# Patient Record
Sex: Male | Born: 1970
Health system: Southern US, Community
[De-identification: ages and names within clinical notes are randomized; demographics above are authoritative.]

## PROBLEM LIST (undated history)

## (undated) DIAGNOSIS — K2270 Barrett's esophagus without dysplasia: Secondary | ICD-10-CM

## (undated) DIAGNOSIS — F32A Depression, unspecified: Secondary | ICD-10-CM

## (undated) DIAGNOSIS — K56609 Unspecified intestinal obstruction, unspecified as to partial versus complete obstruction: Secondary | ICD-10-CM

## (undated) DIAGNOSIS — F329 Major depressive disorder, single episode, unspecified: Secondary | ICD-10-CM

## (undated) DIAGNOSIS — F419 Anxiety disorder, unspecified: Secondary | ICD-10-CM

## (undated) DIAGNOSIS — K219 Gastro-esophageal reflux disease without esophagitis: Secondary | ICD-10-CM

## (undated) HISTORY — PX: NISSEN FUNDOPLICATION: SHX2091

## (undated) HISTORY — DX: Major depressive disorder, single episode, unspecified: F32.9

## (undated) HISTORY — DX: Anxiety disorder, unspecified: F41.9

## (undated) HISTORY — PX: ESOPHAGUS SURGERY: SHX626

## (undated) HISTORY — DX: Barrett's esophagus without dysplasia: K22.70

## (undated) HISTORY — DX: Depression, unspecified: F32.A

## (undated) HISTORY — DX: Gastro-esophageal reflux disease without esophagitis: K21.9

---

## 2004-11-02 ENCOUNTER — Ambulatory Visit: Payer: Self-pay | Admitting: Gastroenterology

## 2005-07-08 ENCOUNTER — Ambulatory Visit: Payer: Self-pay | Admitting: Gastroenterology

## 2005-07-17 ENCOUNTER — Ambulatory Visit: Payer: Self-pay | Admitting: Gastroenterology

## 2005-08-12 ENCOUNTER — Inpatient Hospital Stay: Payer: Self-pay | Admitting: Surgery

## 2006-11-26 ENCOUNTER — Ambulatory Visit: Payer: Self-pay | Admitting: Gastroenterology

## 2009-12-20 ENCOUNTER — Ambulatory Visit: Payer: Self-pay | Admitting: Gastroenterology

## 2009-12-22 LAB — PATHOLOGY REPORT

## 2013-02-08 ENCOUNTER — Ambulatory Visit: Payer: Self-pay | Admitting: Gastroenterology

## 2013-02-09 LAB — PATHOLOGY REPORT

## 2014-01-13 ENCOUNTER — Emergency Department: Payer: Self-pay | Admitting: Emergency Medicine

## 2014-01-13 LAB — CBC WITH DIFFERENTIAL/PLATELET
Basophil #: 0.1 10*3/uL (ref 0.0–0.1)
Basophil %: 1.1 %
Eosinophil #: 0.4 10*3/uL (ref 0.0–0.7)
Eosinophil %: 5.4 %
HCT: 43.4 % (ref 40.0–52.0)
HGB: 14.9 g/dL (ref 13.0–18.0)
Lymphocyte #: 3.3 10*3/uL (ref 1.0–3.6)
Lymphocyte %: 40.9 %
MCH: 30.1 pg (ref 26.0–34.0)
MCHC: 34.3 g/dL (ref 32.0–36.0)
MCV: 88 fL (ref 80–100)
Monocyte #: 0.9 x10 3/mm (ref 0.2–1.0)
Monocyte %: 11.2 %
Neutrophil #: 3.4 10*3/uL (ref 1.4–6.5)
Neutrophil %: 41.4 %
Platelet: 251 10*3/uL (ref 150–440)
RBC: 4.95 10*6/uL (ref 4.40–5.90)
RDW: 14.2 % (ref 11.5–14.5)
WBC: 8.1 10*3/uL (ref 3.8–10.6)

## 2014-01-13 LAB — URINALYSIS, COMPLETE
BACTERIA: NONE SEEN
Bilirubin,UR: NEGATIVE
Glucose,UR: NEGATIVE mg/dL (ref 0–75)
Ketone: NEGATIVE
Leukocyte Esterase: NEGATIVE
Nitrite: NEGATIVE
Ph: 7 (ref 4.5–8.0)
Protein: NEGATIVE
SPECIFIC GRAVITY: 1.006 (ref 1.003–1.030)
Squamous Epithelial: <1
WBC UR: NONE SEEN /HPF (ref 0–5)

## 2014-01-13 LAB — COMPREHENSIVE METABOLIC PANEL
ALK PHOS: 86 U/L
Albumin: 3.7 g/dL (ref 3.4–5.0)
Anion Gap: 10 (ref 7–16)
BUN: 10 mg/dL (ref 7–18)
Bilirubin,Total: 0.5 mg/dL (ref 0.2–1.0)
CHLORIDE: 109 mmol/L — AB (ref 98–107)
Calcium, Total: 9.1 mg/dL (ref 8.5–10.1)
Co2: 25 mmol/L (ref 21–32)
Creatinine: 1.27 mg/dL (ref 0.60–1.30)
EGFR (African American): >60
EGFR (Non-African Amer.): >60
GLUCOSE: 109 mg/dL — AB (ref 65–99)
Osmolality: 286 (ref 275–301)
POTASSIUM: 3.4 mmol/L — AB (ref 3.5–5.1)
SGOT(AST): 28 U/L (ref 15–37)
SGPT (ALT): 47 U/L
Sodium: 144 mmol/L (ref 136–145)
TOTAL PROTEIN: 7.1 g/dL (ref 6.4–8.2)

## 2014-01-15 ENCOUNTER — Emergency Department: Payer: Self-pay | Admitting: Emergency Medicine

## 2014-01-15 LAB — COMPREHENSIVE METABOLIC PANEL
ALBUMIN: 3.4 g/dL (ref 3.4–5.0)
ALK PHOS: 89 U/L
ANION GAP: 13 (ref 7–16)
BUN: 10 mg/dL (ref 7–18)
Bilirubin,Total: 0.4 mg/dL (ref 0.2–1.0)
CHLORIDE: 108 mmol/L — AB (ref 98–107)
CREATININE: 1.3 mg/dL (ref 0.60–1.30)
Calcium, Total: 8.5 mg/dL (ref 8.5–10.1)
Co2: 23 mmol/L (ref 21–32)
EGFR (African American): >60
EGFR (Non-African Amer.): >60
Glucose: 111 mg/dL — ABNORMAL HIGH (ref 65–99)
Osmolality: 287 (ref 275–301)
POTASSIUM: 3.7 mmol/L (ref 3.5–5.1)
SGOT(AST): 33 U/L (ref 15–37)
SGPT (ALT): 41 U/L
Sodium: 144 mmol/L (ref 136–145)
Total Protein: 7.1 g/dL (ref 6.4–8.2)

## 2014-01-15 LAB — CBC
HCT: 40.9 % (ref 40.0–52.0)
HGB: 14 g/dL (ref 13.0–18.0)
MCH: 29.8 pg (ref 26.0–34.0)
MCHC: 34.1 g/dL (ref 32.0–36.0)
MCV: 88 fL (ref 80–100)
Platelet: 260 10*3/uL (ref 150–440)
RBC: 4.68 10*6/uL (ref 4.40–5.90)
RDW: 14.2 % (ref 11.5–14.5)
WBC: 9.4 10*3/uL (ref 3.8–10.6)

## 2014-01-15 LAB — URINALYSIS, COMPLETE
BACTERIA: NONE SEEN
Bilirubin,UR: NEGATIVE
Glucose,UR: NEGATIVE mg/dL (ref 0–75)
KETONE: NEGATIVE
Leukocyte Esterase: NEGATIVE
Nitrite: NEGATIVE
PH: 7 (ref 4.5–8.0)
PROTEIN: NEGATIVE
RBC,UR: 11 /HPF (ref 0–5)
SPECIFIC GRAVITY: 1.01 (ref 1.003–1.030)
Squamous Epithelial: NONE SEEN

## 2014-01-15 LAB — LIPASE, BLOOD: Lipase: 147 U/L (ref 73–393)

## 2014-01-19 ENCOUNTER — Ambulatory Visit: Payer: Self-pay

## 2014-02-04 ENCOUNTER — Ambulatory Visit: Payer: Self-pay

## 2014-05-09 DIAGNOSIS — K219 Gastro-esophageal reflux disease without esophagitis: Secondary | ICD-10-CM | POA: Insufficient documentation

## 2015-11-05 ENCOUNTER — Inpatient Hospital Stay
Admission: EM | Admit: 2015-11-05 | Discharge: 2015-11-17 | DRG: 336 | Disposition: A | Discharge status: Home / Self Care | Payer: BLUE CROSS/BLUE SHIELD | Attending: Surgery | Admitting: Surgery

## 2015-11-05 DIAGNOSIS — R52 Pain, unspecified: Secondary | ICD-10-CM

## 2015-11-05 DIAGNOSIS — R1013 Epigastric pain: Secondary | ICD-10-CM

## 2015-11-05 DIAGNOSIS — K565 Intestinal adhesions [bands], unspecified as to partial versus complete obstruction: Secondary | ICD-10-CM

## 2015-11-05 DIAGNOSIS — K9189 Other postprocedural complications and disorders of digestive system: Secondary | ICD-10-CM

## 2015-11-05 DIAGNOSIS — Z0189 Encounter for other specified special examinations: Secondary | ICD-10-CM

## 2015-11-05 DIAGNOSIS — R627 Adult failure to thrive: Secondary | ICD-10-CM | POA: Diagnosis present

## 2015-11-05 DIAGNOSIS — F411 Generalized anxiety disorder: Secondary | ICD-10-CM

## 2015-11-05 DIAGNOSIS — T43205A Adverse effect of unspecified antidepressants, initial encounter: Secondary | ICD-10-CM

## 2015-11-05 DIAGNOSIS — K567 Ileus, unspecified: Secondary | ICD-10-CM | POA: Diagnosis not present

## 2015-11-05 DIAGNOSIS — K56609 Unspecified intestinal obstruction, unspecified as to partial versus complete obstruction: Secondary | ICD-10-CM

## 2015-11-05 DIAGNOSIS — F19239 Other psychoactive substance dependence with withdrawal, unspecified: Secondary | ICD-10-CM | POA: Diagnosis not present

## 2015-11-05 DIAGNOSIS — K469 Unspecified abdominal hernia without obstruction or gangrene: Secondary | ICD-10-CM | POA: Diagnosis present

## 2015-11-05 DIAGNOSIS — F329 Major depressive disorder, single episode, unspecified: Secondary | ICD-10-CM | POA: Diagnosis present

## 2015-11-05 DIAGNOSIS — K2270 Barrett's esophagus without dysplasia: Secondary | ICD-10-CM | POA: Diagnosis present

## 2015-11-05 HISTORY — DX: Barrett's esophagus without dysplasia: K22.70

## 2015-11-05 LAB — CBC WITH DIFFERENTIAL/PLATELET
Basophils Absolute: 0.1 10*3/uL (ref 0–0.1)
Basophils Relative: 1 %
EOS ABS: 0.4 10*3/uL (ref 0–0.7)
EOS PCT: 3 %
HCT: 51.7 % (ref 40.0–52.0)
Hemoglobin: 18 g/dL (ref 13.0–18.0)
LYMPHS ABS: 1.8 10*3/uL (ref 1.0–3.6)
LYMPHS PCT: 13 %
MCH: 29.8 pg (ref 26.0–34.0)
MCHC: 34.9 g/dL (ref 32.0–36.0)
MCV: 85.3 fL (ref 80.0–100.0)
MONOS PCT: 6 %
Monocytes Absolute: 0.8 10*3/uL (ref 0.2–1.0)
Neutro Abs: 10.8 10*3/uL — ABNORMAL HIGH (ref 1.4–6.5)
Neutrophils Relative %: 77 %
PLATELETS: 368 10*3/uL (ref 150–440)
RBC: 6.06 MIL/uL — ABNORMAL HIGH (ref 4.40–5.90)
RDW: 14.1 % (ref 11.5–14.5)
WBC: 13.9 10*3/uL — ABNORMAL HIGH (ref 3.8–10.6)

## 2015-11-05 MED ORDER — IOPAMIDOL (ISOVUE-300) INJECTION 61%: 30.0000 mL | Freq: Once | INTRAVENOUS | Status: DC | PRN

## 2015-11-05 MED ORDER — ONDANSETRON HCL 4 MG/2ML IJ SOLN
INTRAMUSCULAR | Status: AC
  Filled 2015-11-05: qty 2

## 2015-11-05 MED ORDER — ONDANSETRON HCL 4 MG/2ML IJ SOLN
4.0000 mg | Freq: Once | INTRAMUSCULAR | Status: AC
  Administered 2015-11-05: 4 mg via INTRAVENOUS

## 2015-11-05 MED ORDER — MORPHINE SULFATE (PF) 4 MG/ML IV SOLN
INTRAVENOUS | Status: AC
  Filled 2015-11-05: qty 1

## 2015-11-05 MED ORDER — SODIUM CHLORIDE 0.9 % IV BOLUS (SEPSIS)
1000.0000 mL | Freq: Once | INTRAVENOUS | Status: AC
  Administered 2015-11-05: 1000 mL via INTRAVENOUS

## 2015-11-05 MED ORDER — MORPHINE SULFATE (PF) 4 MG/ML IV SOLN
4.0000 mg | Freq: Once | INTRAVENOUS | Status: AC
  Administered 2015-11-05: 4 mg via INTRAVENOUS

## 2015-11-05 NOTE — ED Triage Notes (Signed)
Pt c/o abdominal abdominal pain X 4 hours. Pt lost consciousness in bathroom approx 30 minutes ago. Pt actively dry heaving, and diaphoretic

## 2015-11-06 ENCOUNTER — Encounter: Payer: Self-pay | Admitting: Radiology

## 2015-11-06 ENCOUNTER — Emergency Department: Payer: BLUE CROSS/BLUE SHIELD

## 2015-11-06 ENCOUNTER — Inpatient Hospital Stay: Payer: BLUE CROSS/BLUE SHIELD

## 2015-11-06 DIAGNOSIS — R1013 Epigastric pain: Secondary | ICD-10-CM | POA: Diagnosis present

## 2015-11-06 DIAGNOSIS — K565 Intestinal adhesions [bands] with obstruction (postprocedural) (postinfection): Secondary | ICD-10-CM | POA: Diagnosis present

## 2015-11-06 DIAGNOSIS — K2270 Barrett's esophagus without dysplasia: Secondary | ICD-10-CM | POA: Diagnosis present

## 2015-11-06 DIAGNOSIS — K567 Ileus, unspecified: Secondary | ICD-10-CM | POA: Diagnosis not present

## 2015-11-06 DIAGNOSIS — K5669 Other intestinal obstruction: Secondary | ICD-10-CM

## 2015-11-06 DIAGNOSIS — K469 Unspecified abdominal hernia without obstruction or gangrene: Secondary | ICD-10-CM | POA: Diagnosis present

## 2015-11-06 DIAGNOSIS — F411 Generalized anxiety disorder: Secondary | ICD-10-CM | POA: Diagnosis present

## 2015-11-06 DIAGNOSIS — R627 Adult failure to thrive: Secondary | ICD-10-CM | POA: Diagnosis present

## 2015-11-06 DIAGNOSIS — F19239 Other psychoactive substance dependence with withdrawal, unspecified: Secondary | ICD-10-CM | POA: Diagnosis not present

## 2015-11-06 DIAGNOSIS — F329 Major depressive disorder, single episode, unspecified: Secondary | ICD-10-CM | POA: Diagnosis present

## 2015-11-06 DIAGNOSIS — K56609 Unspecified intestinal obstruction, unspecified as to partial versus complete obstruction: Secondary | ICD-10-CM | POA: Diagnosis present

## 2015-11-06 LAB — COMPREHENSIVE METABOLIC PANEL
ALK PHOS: 89 U/L (ref 38–126)
ALT: 38 U/L (ref 17–63)
ANION GAP: 14 (ref 5–15)
AST: 47 U/L — ABNORMAL HIGH (ref 15–41)
Albumin: 5.1 g/dL — ABNORMAL HIGH (ref 3.5–5.0)
BILIRUBIN TOTAL: 1.2 mg/dL (ref 0.3–1.2)
BUN: 14 mg/dL (ref 6–20)
CALCIUM: 10.8 mg/dL — AB (ref 8.9–10.3)
CO2: 23 mmol/L (ref 22–32)
Chloride: 102 mmol/L (ref 101–111)
Creatinine, Ser: 1.13 mg/dL (ref 0.61–1.24)
GFR calc Af Amer: >60 mL/min (ref >60)
Glucose, Bld: 177 mg/dL — ABNORMAL HIGH (ref 65–99)
POTASSIUM: 4.6 mmol/L (ref 3.5–5.1)
Sodium: 139 mmol/L (ref 135–145)
TOTAL PROTEIN: 8.5 g/dL — AB (ref 6.5–8.1)

## 2015-11-06 LAB — LACTIC ACID, PLASMA
LACTIC ACID, VENOUS: 1.4 mmol/L (ref 0.5–1.9)
Lactic Acid, Venous: 2.7 mmol/L (ref 0.5–1.9)

## 2015-11-06 LAB — TROPONIN I: Troponin I: <0.03 ng/mL (ref <0.03)

## 2015-11-06 LAB — LIPASE, BLOOD: LIPASE: 30 U/L (ref 11–51)

## 2015-11-06 MED ORDER — ENOXAPARIN SODIUM 40 MG/0.4ML ~~LOC~~ SOLN
40.0000 mg | SUBCUTANEOUS | Status: DC
  Administered 2015-11-06 – 2015-11-17 (×12): 40 mg via SUBCUTANEOUS
  Filled 2015-11-06 (×13): qty 0.4

## 2015-11-06 MED ORDER — MENTHOL 3 MG MT LOZG
1.0000 | LOZENGE | OROMUCOSAL | Status: DC | PRN
  Administered 2015-11-06: 3 mg via ORAL
  Filled 2015-11-06: qty 9

## 2015-11-06 MED ORDER — MORPHINE SULFATE (PF) 4 MG/ML IV SOLN
4.0000 mg | Freq: Once | INTRAVENOUS | Status: AC
  Administered 2015-11-06: 4 mg via INTRAVENOUS

## 2015-11-06 MED ORDER — FAMOTIDINE IN NACL 20-0.9 MG/50ML-% IV SOLN
20.0000 mg | Freq: Two times a day (BID) | INTRAVENOUS | Status: DC
  Administered 2015-11-06 – 2015-11-15 (×19): 20 mg via INTRAVENOUS
  Filled 2015-11-06 (×22): qty 50

## 2015-11-06 MED ORDER — ACETAMINOPHEN 650 MG RE SUPP
650.0000 mg | Freq: Four times a day (QID) | RECTAL | Status: DC | PRN
  Administered 2015-11-07: 650 mg via RECTAL
  Filled 2015-11-06 (×2): qty 1

## 2015-11-06 MED ORDER — PROMETHAZINE HCL 25 MG/ML IJ SOLN
INTRAMUSCULAR | Status: AC
  Filled 2015-11-06: qty 1

## 2015-11-06 MED ORDER — ONDANSETRON 4 MG PO TBDP: 4.0000 mg | ORAL_TABLET | Freq: Four times a day (QID) | ORAL | Status: DC | PRN

## 2015-11-06 MED ORDER — KCL IN DEXTROSE-NACL 20-5-0.45 MEQ/L-%-% IV SOLN
INTRAVENOUS | Status: DC
  Administered 2015-11-06 – 2015-11-11 (×10): via INTRAVENOUS
  Filled 2015-11-06 (×14): qty 1000

## 2015-11-06 MED ORDER — PROMETHAZINE HCL 25 MG/ML IJ SOLN
12.5000 mg | Freq: Once | INTRAMUSCULAR | Status: AC
  Administered 2015-11-06: 12.5 mg via INTRAVENOUS

## 2015-11-06 MED ORDER — ACETAMINOPHEN 325 MG PO TABS: 650.0000 mg | ORAL_TABLET | Freq: Four times a day (QID) | ORAL | Status: DC | PRN

## 2015-11-06 MED ORDER — HYDROMORPHONE HCL 1 MG/ML IJ SOLN
1.0000 mg | INTRAMUSCULAR | Status: DC | PRN
  Administered 2015-11-06 – 2015-11-08 (×8): 1 mg via INTRAVENOUS
  Filled 2015-11-06 (×8): qty 1

## 2015-11-06 MED ORDER — DIAZEPAM 5 MG/ML IJ SOLN
5.0000 mg | Freq: Three times a day (TID) | INTRAMUSCULAR | Status: DC | PRN
  Administered 2015-11-06 – 2015-11-07 (×2): 5 mg via INTRAVENOUS
  Filled 2015-11-06 (×2): qty 2

## 2015-11-06 MED ORDER — ONDANSETRON HCL 4 MG/2ML IJ SOLN
4.0000 mg | Freq: Four times a day (QID) | INTRAMUSCULAR | Status: DC | PRN
  Administered 2015-11-06 – 2015-11-10 (×8): 4 mg via INTRAVENOUS
  Filled 2015-11-06 (×9): qty 2

## 2015-11-06 MED ORDER — MORPHINE SULFATE (PF) 4 MG/ML IV SOLN
INTRAVENOUS | Status: AC
  Filled 2015-11-06: qty 1

## 2015-11-06 MED ORDER — IOPAMIDOL (ISOVUE-300) INJECTION 61%
100.0000 mL | Freq: Once | INTRAVENOUS | Status: AC | PRN
  Administered 2015-11-06: 100 mL via INTRAVENOUS

## 2015-11-06 MED ORDER — SODIUM CHLORIDE 0.9 % IV BOLUS (SEPSIS)
1000.0000 mL | Freq: Once | INTRAVENOUS | Status: AC
Start: 2015-11-06 — End: 2015-11-06
  Administered 2015-11-06: 1000 mL via INTRAVENOUS

## 2015-11-06 NOTE — Progress Notes (Signed)
Patient medicated again for pain and now feels much better. His nasogastric tube apparently was clogged and is being replaced with a larger bore 16-gauge NG tube.  Physical exam. Patient appears comfortable abdomen is soft nondistended nontympanitic and nontender at this time  If patient is no better and KUB appears to show no sign of worsening or improvement patient may require surgery depending on those results tomorrow.

## 2015-11-06 NOTE — Progress Notes (Signed)
Discussed patient with Dr. Michela PitcherEly. Patient has just arrived on the floor and a nasogastric tube is in place. He has been medicated for pain but is awake Physical exam demonstrates a minimally distended abdomen nontympanitic and nontender.  I will personally reviewed his CT scan and discuss tendril options with the patient but initially I would give him some opportunity to resolve believing that this is likely related to adhesive bowel disease. She worsen at any time or this failure to resolve quickly then surgical intervention will be necessary.

## 2015-11-06 NOTE — Progress Notes (Signed)
Clinical Child psychotherapistocial Worker (CSW) received consult for assistance with medications. RN Case Manager aware of above. Please reconsult if future social work needs arise. CSW signing off.   Baker Hughes IncorporatedBailey Tracina Beaumont, LCSW [phone]

## 2015-11-06 NOTE — ED Notes (Signed)
Patient transported to CT 

## 2015-11-06 NOTE — Care Management (Signed)
Patient admitted with SBO.  NG present to suction.  Patient is listed as self pay patient.  Patient states that he has active coverage with [employer] and [employer] [place], but does not have his card with him.  Patient to see if a family member can bring his insurance card.  I have called [employer] on patient's behalf.  Representative informed me that they were unable to provide me with any patient information.  Representative states that patient would have to call himself.  Patient denies any issues obtaining his medication.  RNCM available

## 2015-11-06 NOTE — H&P (Signed)
Nathaniel Hess is a 45 y.o. male  approximately 10 hour history of abdominal pain.  HPI: He was in his usual state of good health until last evening approximately 6 PM when he had his evening meal. He developed fairly sudden onset of periumbilical mid epigastric upper abdominal pain. The pain was constant although did have some increasing severity consistent with cramping. He felt that he had a "gas bubble". His symptoms did not improve with any activity. He was severely nauseated but did not vomit. He did pass some gas and had a normal bowel movement on the day prior to admission. He's never had any previous similar symptoms.  He has history of esophagus and underwent a laparoscopic Nissen fundoplication performed by Dr. Katrinka Blazing in 2007. He has not had extensive symptoms since that time. However, he has been unable to vomit since that time and likely would've vomited multiple times with recurrent abdominal discomfort.  He denies any history of other GI problems. Specifically, he denies history of hepatitis, yellow jaundice, peptic ulcer disease, pancreatitis, gallbladder disease, or diverticulitis. He's had no previous colonoscopy. Denies any other surgical history other than the Nissen fundoplication.  He does not have any significant medical problems. He saw his primary care physician 2 weeks ago for a routine annual physical. No significant problems were identified. He presented the emergency for further evaluation. He has slightly elevated white blood cell count of 13,000 with relatively normal electrolyte panel. Plain films reveal significant distention is gastric bubble. He had a lactic acid of 2.1. CT scan with contrast demonstrated what appeared to be high-grade bowel obstruction just below his umbilicus. No internal hernias or volvulus were noted. Surgical service was consulted.  Past Medical History:  Diagnosis Date  . Barrett's esophagus    Past Surgical History:  Procedure Laterality Date   . ESOPHAGUS SURGERY     Social History   Social History  . Marital status: Married    Spouse name: N/A  . Number of children: N/A  . Years of education: N/A   Social History Main Topics  . Smoking status: Never Smoker  . Smokeless tobacco: Never Used  . Alcohol use No  . Drug use: Unknown  . Sexual activity: Not Asked   Other Topics Concern  . None   Social History Narrative  . None     Review of Systems  Constitutional: Negative for chills, fever, malaise/fatigue and weight loss.  HENT: Negative.   Eyes: Negative.   Respiratory: Negative for cough, sputum production, shortness of breath and wheezing.   Cardiovascular: Negative for chest pain and palpitations.  Gastrointestinal: Positive for abdominal pain and nausea. Negative for constipation, diarrhea, heartburn and vomiting.  Genitourinary: Negative.   Musculoskeletal: Negative.   Skin: Negative.   Neurological: Negative.   Psychiatric/Behavioral: Negative for depression. The patient is not nervous/anxious.      PHYSICAL EXAM: BP (!) 155/97 (BP Location: Right Arm)   Pulse 83   Temp 97.3 F (36.3 C) (Rectal)   Resp 16   Ht 5\' 6"  (1.676 m)   Wt 108.9 kg (240 lb)   SpO2 97%   BMI 38.74 kg/m   Physical Exam  Constitutional: He is oriented to person, place, and time. He appears well-developed and well-nourished. No distress.  Slightly confused from his pain medication  HENT:  Head: Normocephalic and atraumatic.  Eyes: EOM are normal. Pupils are equal, round, and reactive to light.  Neck: Normal range of motion. Neck supple.  Cardiovascular: Normal rate,  regular rhythm and normal heart sounds.   Pulmonary/Chest: Effort normal and breath sounds normal. He has no wheezes. He has no rales.  Abdominal: Soft. He exhibits distension. He exhibits no mass. There is tenderness. There is guarding.  Musculoskeletal: Normal range of motion. He exhibits no edema.  Neurological: He is alert and oriented to person,  place, and time.  Skin: Skin is warm and dry. He is not diaphoretic.  Psychiatric: He has a normal mood and affect. Judgment and thought content normal.   His abdomen is slightly distended but minimally tender currently. He has some bowel sounds with no tinkling or high-pitched bowel sounds. No hernias are noted. He has no rebound or guarding. He does not have any peritoneal signs.  Impression/Plan: I have independently reviewed his CT scan. He does appear to have a high-grade small bowel obstruction. The nasogastric tube has been placed. He is more comfortable currently. We will follow his progress over the next several hours. He does have such a high-grade bowel obstruction I'm not certain decompression will be sufficient for resolution. I did discuss possible surgical intervention with him. We plan follow-up x-rays pain medication. He is in agreement with this plan.   Tiney Rougealph Ely III, MD  11/06/2015, 5:17 AM

## 2015-11-06 NOTE — ED Notes (Signed)
MD Webster at bedside 

## 2015-11-06 NOTE — ED Provider Notes (Signed)
[facility] Emergency Department Provider Note   ____________________________________________   First MD Initiated Contact with Patient 11/05/15 2341     (approximate)  I have reviewed the triage vital signs and the nursing notes.   HISTORY  Chief Complaint Abdominal Pain    HPI Nathaniel Hess is a 45 y.o. male who comes into the hospital with abdominal pain. He reports that the pain started 4 hours ago. The patient's significant other reports that he passed out at home from the pain. He has been painting as well due to this pain. He denies any pain in his chest. He takes Cymbalta, BuSpar and Xanax. The pain is in his mid abdomen. His Abdomen also seems very distended and tender. He's had some nausea and dry heaving. The patient is unable to vomit because he's had a Nissen fundoplication. He had just eaten some chicken prior to the pain starting. The patient has never had pain like this before. The patient reports his pain a 10 out of 10 in intensity. The patient's significant other is very concerned about his pain. He is sweating and panting. Patient has had no fevers and no diarrhea.   Past Medical History:  Diagnosis Date  . Barrett's esophagus     Patient Active Problem List   Diagnosis Date Noted  . SBO (small bowel obstruction) (HCC) 11/06/2015    Past Surgical History:  Procedure Laterality Date  . ESOPHAGUS SURGERY      Prior to Admission medications   Medication Sig Start Date End Date Taking? Authorizing Provider  DULoxetine (CYMBALTA) 60 MG capsule Take 60 mg by mouth 2 (two) times daily.   Yes Historical Provider, MD  traZODone (DESYREL) 50 MG tablet Take 50-100 mg by mouth at bedtime as needed for sleep.   Yes Historical Provider, MD  ALPRAZolam (XANAX) 0.25 MG tablet Take 0.25 mg by mouth 2 (two) times daily as needed for anxiety or sleep. Do not use every day    Historical Provider, MD  busPIRone (BUSPAR) 15 MG tablet Take 15 mg by  mouth 2 (two) times daily.    Historical Provider, MD  omeprazole (PRILOSEC) 20 MG capsule Take 20 mg by mouth daily as needed (heartburn/ acid reflux).    Historical Provider, MD    Allergies Review of patient's allergies indicates no known allergies.  No family history on file.  Social History Social History  Substance Use Topics  . Smoking status: Never Smoker  . Smokeless tobacco: Never Used  . Alcohol use No    Review of Systems Constitutional: No fever/chills Eyes: No visual changes. ENT: No sore throat. Cardiovascular: Denies chest pain. Respiratory:  shortness of breath. Gastrointestinal:  abdominal pain. nausea, vomiting.  No diarrhea.  No constipation. Genitourinary: Negative for dysuria. Musculoskeletal: Negative for back pain. Skin: Negative for rash. Neurological: Negative for headaches, focal weakness or numbness.  10-point ROS otherwise negative.  ____________________________________________   PHYSICAL EXAM:  VITAL SIGNS: ED Triage Vitals  Enc Vitals Group     BP 11/05/15 2330 (!) 133/102     Pulse Rate 11/05/15 2330 (!) 114     Resp 11/06/15 0002 12     Temp 11/06/15 0000 97.3 F (36.3 C)     Temp Source 11/06/15 0000 Rectal     SpO2 11/05/15 2330 100 %     Weight 11/05/15 2343 240 lb (108.9 kg)     Height 11/05/15 2343 5\' 6"  (1.676 m)     Head Circumference --  Peak Flow --      Pain Score 11/06/15 0002 8     Pain Loc --      Pain Edu? --      Excl. in GC? --     Constitutional: Alert and oriented. Ill appearing and in moderate to severe distress. Eyes: Conjunctivae are normal. PERRL. EOMI. Head: Atraumatic. Nose: No congestion/rhinnorhea. Mouth/Throat: Mucous membranes are moist.  Oropharynx non-erythematous. Cardiovascular: Normal rate, regular rhythm. Grossly normal heart sounds.  Good peripheral circulation. Respiratory: Normal respiratory effort.  No retractions. Lungs CTAB. Gastrointestinal: Soft with some midline tenderness to  palpation, distention. diminished bowel sounds Musculoskeletal: No lower extremity tenderness nor edema.   Neurologic:  Normal speech and language.  Skin:  Cool and diaphoretic Psychiatric: Mood and affect are normal.   ____________________________________________   LABS (all labs ordered are listed, but only abnormal results are displayed)  Labs Reviewed  CBC WITH DIFFERENTIAL/PLATELET - Abnormal; Notable for the following:       Result Value   WBC 13.9 (*)    RBC 6.06 (*)    Neutro Abs 10.8 (*)    All other components within normal limits  COMPREHENSIVE METABOLIC PANEL - Abnormal; Notable for the following:    Glucose, Bld 177 (*)    Calcium 10.8 (*)    Total Protein 8.5 (*)    Albumin 5.1 (*)    AST 47 (*)    All other components within normal limits  LACTIC ACID, PLASMA - Abnormal; Notable for the following:    Lactic Acid, Venous 2.7 (*)    All other components within normal limits  TROPONIN I  LIPASE, BLOOD  LACTIC ACID, PLASMA  CBC  CREATININE, SERUM   ____________________________________________  EKG  ED ECG REPORT I, Rebecka ApleyWebster,  Allison P, the attending physician, personally viewed and interpreted this ECG.   Date: 11/06/2015  EKG Time: 2332  Rate: 107  Rhythm: sinus tachycardia  Axis: normal  Intervals:none  ST&T Change: Some mild intermittent ST segment elevation lead aVF, possible in leads 2 and 3 we'll repeat EKG  ED ECG REPORT#2 I, Rebecka ApleyWebster,  Allison P, the attending physician, personally viewed and interpreted this ECG.   Date: 11/06/2015  EKG Time: 2337  Rate: 101  Rhythm: sinus tachycardia  Axis: normal  Intervals:none  ST&T Change: none  ED ECG REPORT#3 I, Rebecka ApleyWebster,  Allison P, the attending physician, personally viewed and interpreted this ECG.   Date: 11/06/2015  EKG Time: 0032  Rate: 88  Rhythm: normal sinus rhythm  Axis: normal  Intervals:none  ST&T Change: none  ____________________________________________  RADIOLOGY  CT  abd and pelvis ____________________________________________   PROCEDURES  Procedure(s) performed: None  Procedures  Critical Care performed: No  ____________________________________________   INITIAL IMPRESSION / ASSESSMENT AND PLAN / ED COURSE  Pertinent labs & imaging results that were available during my care of the patient were reviewed by me and considered in my medical decision making (see chart for details).  This is a 45 year old male who comes into the hospital today with some abdominal pain. The patient is tachypneic and diaphoretic. His initial EKG had some concern for ST segment elevation but his repeat did not show any. I am concerned about intra-abdominal pathology. I did a bedside ultrasound but did not see the patient's aorta. He will be sent for a CT scan for further evaluation. The patient received 2 do as Zofran and phenergan. He also will receive a liter of normal saline.  Clinical Course  Value Comment  By Time  CT Abdomen Pelvis W Contrast No active cardiopulmonary disease Rebecka Apley, MD 09/11 0228  DG Chest Portable 1 View 1. No acute pulmonary process. 2. Gaseous gastric distention in the upper abdomen. No evidence of free air under the diaphragm.   Rebecka Apley, MD 09/11 [id number]   Once the CT scan was performed I was able to look at the CT scan to notice some gastric distention. I had the nurse place an NG tube. The patient initially did not like the NG tube but his tachypnea improved with the decompression of the stomach. The patient had surgery done by Dr. Katrinka Blazing for multiple years ago. Initially his family requested that he go to [facility] with [facility] was concerned given the patient's elevated lactic acid as well as white blood cell count in his discomfort. They felt that it was preferred for the patient to be seen by a surgeon here. I contacted Dr. Katrinka Blazing who reports that he no longer performs colonic surgeries and requested that we contact the surgical  hospitalist. I did contact Dr. Michela Pitcher who did report that he would come and see the patient. The patient was comfortable and resting for some time in the emergency department but was then seen by Dr. Michela Pitcher who will admit the patient to his service. After the second liter of normal saline the patient's lactic acid was down to 1.4. The patient will be admitted to the surgical service.  ____________________________________________   FINAL CLINICAL IMPRESSION(S) / ED DIAGNOSES  Final diagnoses:  Pain  Small bowel obstruction due to adhesions (HCC)  Epigastric pain      NEW MEDICATIONS STARTED DURING THIS VISIT:  New Prescriptions   No medications on file     Note:  This document was prepared using Dragon voice recognition software and may include unintentional dictation errors.    Rebecka Apley, MD 11/06/15 [phone]

## 2015-11-06 NOTE — Progress Notes (Signed)
11/06/2015 4:46 PM    Pt stated he takes 0.25 Xanax P.O. BID PRN at home.  Dr. Excell Seltzerooper does not wish to order at this time due to NPO status.  Declined to order other anxiety meds at this time.  Will continue to monitor and assess. Bradly Chrisougherty, Yaminah Clayborn E, RN

## 2015-11-07 ENCOUNTER — Inpatient Hospital Stay: Payer: BLUE CROSS/BLUE SHIELD

## 2015-11-07 LAB — BASIC METABOLIC PANEL
ANION GAP: 7 (ref 5–15)
BUN: 12 mg/dL (ref 6–20)
CHLORIDE: 107 mmol/L (ref 101–111)
CO2: 26 mmol/L (ref 22–32)
CREATININE: 1.1 mg/dL (ref 0.61–1.24)
Calcium: 8.7 mg/dL — ABNORMAL LOW (ref 8.9–10.3)
GFR calc non Af Amer: >60 mL/min (ref >60)
Glucose, Bld: 133 mg/dL — ABNORMAL HIGH (ref 65–99)
POTASSIUM: 4.1 mmol/L (ref 3.5–5.1)
SODIUM: 140 mmol/L (ref 135–145)

## 2015-11-07 LAB — CBC
HEMATOCRIT: 45.4 % (ref 40.0–52.0)
HEMOGLOBIN: 16 g/dL (ref 13.0–18.0)
MCH: 30.4 pg (ref 26.0–34.0)
MCHC: 35.2 g/dL (ref 32.0–36.0)
MCV: 86.4 fL (ref 80.0–100.0)
PLATELETS: 271 10*3/uL (ref 150–440)
RBC: 5.25 MIL/uL (ref 4.40–5.90)
RDW: 13.9 % (ref 11.5–14.5)
WBC: 9.2 10*3/uL (ref 3.8–10.6)

## 2015-11-07 NOTE — Progress Notes (Signed)
Visited patient. He states that he has not passed any gas is not feeling any rumbling to suggest that he is passing material to his lower intestinal tract. He did receive a Tylenol suppository for headache.  Abdomen is soft nondistended nontympanitic and nontender no guarding no rebound no percussion tenderness and no mass  In spite of x-rays appearing improved if he is not better tomorrow and passing gas and his x-ray is not showing complete resolution then surgery will be indicated and this was discussed with he and his family members.

## 2015-11-07 NOTE — Progress Notes (Signed)
Follow-up after x-ray.  Patient has failed to pass any gas he complains of headache as his chief complaint he has no nausea or vomiting and his NG tube is functional.  Abdominal exam is unchanged and nontender  KUB is personally reviewed showing improvement with gas in the colon.  Discussed with patient and family the rationale for only offering surgery should he worsen or fail to progress at this point he has shown improvement over last night's x-ray and I would recommend repeating an x-ray tomorrow morning should that show no improvement then surgical intervention would be entertained.

## 2015-11-07 NOTE — Progress Notes (Signed)
Dr Excell Seltzerooper notified that patient has not voided since 200 am, bladder scan done patient had 401 ml in bladder. No new orders received. Dr. Excell Seltzerooper stated will watch for now.

## 2015-11-07 NOTE — Progress Notes (Signed)
CC: Small bowel obstruction Subjective: Patient states that he has no abdominal pain this morning he does state that he was medicated once last night but no pain now he is not passing any gas however planes of a headache. Bowel movement oh fevers or chills  Objective: Vital signs in last 24 hours: Temp:  [97.4 F (36.3 C)-98.5 F (36.9 C)] 97.8 F (36.6 C) (09/12 0519) Pulse Rate:  [71-96] 93 (09/12 0519) Resp:  [12-20] 20 (09/12 0519) BP: (132-155)/(83-103) 155/97 (09/12 0519) SpO2:  [83 %-100 %] 100 % (09/12 0519) Last BM Date:  (per pt 5-7 days)  Intake/Output from previous day: 09/11 0701 - 09/12 0700 In: 2448 [I.V.:2188; NG/GT:160; IV Piggyback:100] Out: 3070 [Urine:320; Emesis/NG output:2750] Intake/Output this shift: No intake/output data recorded.  Physical exam:  Vital signs are reviewed and stable.  Patient appears comfortable with a nasogastric tube in place abdomen is soft nondistended nontympanitic and nontender calves are nontender no jaundice  Lab Results: CBC   Recent Labs  11/05/15 2339 11/07/15 0435  WBC 13.9* 9.2  HGB 18.0 16.0  HCT 51.7 45.4  PLT 368 271   BMET  Recent Labs  11/05/15 2339 11/07/15 0435  NA 139 140  K 4.6 4.1  CL 102 107  CO2 23 26  GLUCOSE 177* 133*  BUN 14 12  CREATININE 1.13 1.10  CALCIUM 10.8* 8.7*   PT/INR No results for input(s): LABPROT, INR in the last 72 hours. ABG No results for input(s): PHART, HCO3 in the last 72 hours.  Invalid input(s): PCO2, PO2  Studies/Results: Ct Abdomen Pelvis W Contrast  Result Date: 11/06/2015 CLINICAL DATA:  Abdominal pain for 4 hours. EXAM: CT ABDOMEN AND PELVIS WITH CONTRAST TECHNIQUE: Multidetector CT imaging of the abdomen and pelvis was performed using the standard protocol following bolus administration of intravenous contrast. CONTRAST:  100ml ISOVUE-300 IOPAMIDOL (ISOVUE-300) INJECTION 61%, 100mL ISOVUE-300 IOPAMIDOL (ISOVUE-300) INJECTION 61% COMPARISON:  01/13/2014  FINDINGS: Lower chest: No significant abnormality Hepatobiliary: Diffuse fatty infiltration of the liver. Focal low-attenuation 1.4 cm lesion in the left hepatic lobe dome likely represents a cyst, unchanged from 01/13/2014. No suspicious focal liver lesions. Gallbladder and bile ducts are unremarkable. Pancreas: Normal Spleen: Normal Adrenals/Urinary Tract: The adrenals and kidneys are normal in appearance. There is no urinary calculus evident. There is no hydronephrosis or ureteral dilatation. Collecting systems and ureters appear unremarkable. Stomach/Bowel: Marked distention of the stomach and small bowel with abrupt transition to decompressed distal jejunum. Transition point is in the anterior abdomen a few cm above the umbilicus, visible on images 50-51 series 2 and on coronal image 17 series 5. This is a high-grade small bowel obstruction. Colon is unremarkable. Appendix is normal. Vascular/Lymphatic: Insert negative vascular Reproductive: Unremarkable Other: No ascites.  No extraluminal air. Musculoskeletal: No significant skeletal lesion. IMPRESSION: High-grade small bowel obstruction with transition point in the anterior abdomen a few cm above the umbilicus, in the midline. No mass or hernia is evident at the transition point. This probably is due to adhesions. Fatty liver. Electronically Signed   By: Ellery Plunkaniel R Mitchell M.D.   On: 11/06/2015 02:11   Dg Chest Port 1 View  Result Date: 11/06/2015 CLINICAL DATA:  Pain. EXAM: PORTABLE CHEST 1 VIEW COMPARISON:  Radiograph earlier this day at 00:01 hour FINDINGS: Enteric tube is been placed, tip in the stomach, side-port in the region of the gastroesophageal junction. Gaseous gastric distention persists. Low lung volumes without focal airspace disease, pleural effusion or pneumothorax. Cardiomediastinal contours are unchanged. IMPRESSION:  Tip of the enteric tube in the stomach, side-port in the region of the gastroesophageal junction. Advancement of at least  3 cm recommended for optimal placement. Low lung volumes. Electronically Signed   By: Rubye Oaks M.D.   On: 11/06/2015 03:12   Dg Chest Portable 1 View  Result Date: 11/06/2015 CLINICAL DATA:  Abdominal pain for 4 hours.  Evaluate for free air. EXAM: PORTABLE CHEST 1 VIEW COMPARISON:  None. FINDINGS: The cardiomediastinal contours are normal. The lungs are clear. Calcified granuloma in the left upper lung. Pulmonary vasculature is normal. No consolidation, pleural effusion, or pneumothorax. No acute osseous abnormalities are seen. There is gaseous gastric distention in the upper abdomen. No gross evidence of free air under the hemidiaphragms. IMPRESSION: 1.  No acute pulmonary process. 2. Gaseous gastric distention in the upper abdomen. No evidence of free air under the diaphragm. Electronically Signed   By: Rubye Oaks M.D.   On: 11/06/2015 01:30   Dg Abd Portable 1v  Result Date: 11/06/2015 CLINICAL DATA:  45 year old male with NG tube replacement. Initial encounter. EXAM: PORTABLE ABDOMEN - 1 VIEW COMPARISON:  CT Abdomen and Pelvis 0043 hours today, and earlier FINDINGS: Portable AP supine view at 1655 hours. Gaseous distension of the stomach and gas-filled abnormally dilated abdominal small bowel loops appear stable from the CT Abdomen and Pelvis earlier today. Negative lung bases. No definite pneumoperitoneum on this supine view. Stable visualized osseous structures. Gastrohepatic ligament region surgical clips re- demonstrated. IMPRESSION: 1. Enteric tube in place, side hole at the level of the proximal gastric body. 2. Stable small bowel obstruction, gastric distension. Electronically Signed   By: Odessa Fleming M.D.   On: 11/06/2015 17:11    Anti-infectives: Anti-infectives    None      Assessment/Plan:  KUB from last night is been reviewed after the nasogastric tube was changed. As a large gastric bubble as of last night is labs are reviewed as well. His KUB this morning has not been  taken yet. If his KUB this morning looks as it did last night I will recommend surgery in this patient with a likely small bowel obstruction due to adhesions. The rationale for this was discussed with him and I will see him later this morning and make that decision.  Lattie Haw, MD, FACS  11/07/2015

## 2015-11-08 ENCOUNTER — Inpatient Hospital Stay: Payer: BLUE CROSS/BLUE SHIELD

## 2015-11-08 ENCOUNTER — Inpatient Hospital Stay: Payer: BLUE CROSS/BLUE SHIELD | Admitting: Certified Registered"

## 2015-11-08 ENCOUNTER — Encounter: Payer: Self-pay | Admitting: *Deleted

## 2015-11-08 ENCOUNTER — Encounter
Admission: EM | Disposition: A | Discharge status: Home / Self Care | Payer: Self-pay | Source: Home / Self Care | Attending: Surgery

## 2015-11-08 HISTORY — PX: LAPAROTOMY: SHX154

## 2015-11-08 HISTORY — PX: BOWEL RESECTION: SHX1257

## 2015-11-08 SURGERY — LAPAROTOMY, EXPLORATORY
Anesthesia: General | Wound class: Clean Contaminated

## 2015-11-08 MED ORDER — CEFAZOLIN SODIUM-DEXTROSE 2-4 GM/100ML-% IV SOLN
INTRAVENOUS | Status: AC
  Filled 2015-11-08: qty 100

## 2015-11-08 MED ORDER — FENTANYL CITRATE (PF) 100 MCG/2ML IJ SOLN
INTRAMUSCULAR | Status: DC | PRN
Start: 2015-11-08 — End: 2015-11-08
  Administered 2015-11-08: 50 ug via INTRAVENOUS
  Administered 2015-11-08: 100 ug via INTRAVENOUS

## 2015-11-08 MED ORDER — HYDROMORPHONE 1 MG/ML IV SOLN
INTRAVENOUS | Status: DC
  Filled 2015-11-08: qty 25

## 2015-11-08 MED ORDER — HYDROMORPHONE HCL 1 MG/ML IJ SOLN
INTRAMUSCULAR | Status: DC | PRN
Start: 2015-11-08 — End: 2015-11-08
  Administered 2015-11-08 (×2): 1 mg via INTRAVENOUS

## 2015-11-08 MED ORDER — PROPOFOL 10 MG/ML IV BOLUS
INTRAVENOUS | Status: DC | PRN
  Administered 2015-11-08: 180 mg via INTRAVENOUS

## 2015-11-08 MED ORDER — SUGAMMADEX SODIUM 200 MG/2ML IV SOLN
INTRAVENOUS | Status: DC | PRN
  Administered 2015-11-08: 200 mg via INTRAVENOUS

## 2015-11-08 MED ORDER — HYDROMORPHONE 1 MG/ML IV SOLN
INTRAVENOUS | Status: DC
  Administered 2015-11-08: 2.7 mg via INTRAVENOUS
  Administered 2015-11-08: 15:00:00 via INTRAVENOUS
  Administered 2015-11-09: 2.7 mg via INTRAVENOUS
  Administered 2015-11-09 (×2): 2.4 mg via INTRAVENOUS
  Administered 2015-11-09: 2.1 mg via INTRAVENOUS
  Administered 2015-11-09: 20:00:00 via INTRAVENOUS
  Administered 2015-11-09: 0.9 mg via INTRAVENOUS
  Administered 2015-11-09: 2.4 mg via INTRAVENOUS
  Administered 2015-11-10: 3.6 mg via INTRAVENOUS
  Administered 2015-11-10: 3 mg via INTRAVENOUS
  Filled 2015-11-08: qty 25

## 2015-11-08 MED ORDER — HYDROMORPHONE HCL 1 MG/ML IJ SOLN
INTRAMUSCULAR | Status: AC
  Filled 2015-11-08: qty 2

## 2015-11-08 MED ORDER — NALOXONE HCL 0.4 MG/ML IJ SOLN: 0.4000 mg | INTRAMUSCULAR | Status: DC | PRN

## 2015-11-08 MED ORDER — DIAZEPAM 5 MG/ML IJ SOLN
5.0000 mg | Freq: Four times a day (QID) | INTRAMUSCULAR | Status: DC | PRN
  Administered 2015-11-09 – 2015-11-12 (×6): 5 mg via INTRAVENOUS
  Filled 2015-11-08 (×6): qty 2

## 2015-11-08 MED ORDER — ROCURONIUM BROMIDE 100 MG/10ML IV SOLN
INTRAVENOUS | Status: DC | PRN
  Administered 2015-11-08: 30 mg via INTRAVENOUS
  Administered 2015-11-08: 5 mg via INTRAVENOUS

## 2015-11-08 MED ORDER — DIPHENHYDRAMINE HCL 12.5 MG/5ML PO ELIX: 12.5000 mg | ORAL_SOLUTION | Freq: Four times a day (QID) | ORAL | Status: DC | PRN

## 2015-11-08 MED ORDER — FENTANYL CITRATE (PF) 100 MCG/2ML IJ SOLN
INTRAMUSCULAR | Status: AC
  Filled 2015-11-08: qty 2

## 2015-11-08 MED ORDER — PHENYLEPHRINE HCL 10 MG/ML IJ SOLN
INTRAMUSCULAR | Status: DC | PRN
  Administered 2015-11-08: 200 ug via INTRAVENOUS
  Administered 2015-11-08: 100 ug via INTRAVENOUS

## 2015-11-08 MED ORDER — ONDANSETRON HCL 4 MG/2ML IJ SOLN: 4.0000 mg | Freq: Once | INTRAMUSCULAR | Status: DC | PRN

## 2015-11-08 MED ORDER — CEFAZOLIN SODIUM-DEXTROSE 2-4 GM/100ML-% IV SOLN
2.0000 g | INTRAVENOUS | Status: AC
  Administered 2015-11-08: 2 g via INTRAVENOUS

## 2015-11-08 MED ORDER — BUPIVACAINE HCL (PF) 0.25 % IJ SOLN
INTRAMUSCULAR | Status: AC
  Filled 2015-11-08: qty 30

## 2015-11-08 MED ORDER — HYDROMORPHONE HCL 1 MG/ML IJ SOLN
0.2500 mg | INTRAMUSCULAR | Status: DC | PRN
  Administered 2015-11-08: 0.25 mg via INTRAVENOUS
  Administered 2015-11-08 (×3): 0.5 mg via INTRAVENOUS
  Administered 2015-11-08: 0.25 mg via INTRAVENOUS

## 2015-11-08 MED ORDER — LIDOCAINE HCL (CARDIAC) 20 MG/ML IV SOLN
INTRAVENOUS | Status: DC | PRN
  Administered 2015-11-08: 100 mg via INTRAVENOUS

## 2015-11-08 MED ORDER — HYDROMORPHONE HCL 1 MG/ML IJ SOLN
INTRAMUSCULAR | Status: AC
  Administered 2015-11-08: 0.5 mg via INTRAVENOUS
  Filled 2015-11-08: qty 1

## 2015-11-08 MED ORDER — LACTATED RINGERS IV SOLN
INTRAVENOUS | Status: DC | PRN
  Administered 2015-11-08: 11:00:00 via INTRAVENOUS

## 2015-11-08 MED ORDER — SODIUM CHLORIDE 0.9% FLUSH: 9.0000 mL | INTRAVENOUS | Status: DC | PRN

## 2015-11-08 MED ORDER — ONDANSETRON HCL 4 MG/2ML IJ SOLN: 4.0000 mg | Freq: Four times a day (QID) | INTRAMUSCULAR | Status: DC | PRN

## 2015-11-08 MED ORDER — BUPIVACAINE HCL (PF) 0.25 % IJ SOLN
INTRAMUSCULAR | Status: DC | PRN
  Administered 2015-11-08: 30 mL

## 2015-11-08 MED ORDER — ONDANSETRON HCL 4 MG/2ML IJ SOLN
INTRAMUSCULAR | Status: DC | PRN
  Administered 2015-11-08: 4 mg via INTRAVENOUS

## 2015-11-08 MED ORDER — FENTANYL CITRATE (PF) 100 MCG/2ML IJ SOLN
25.0000 ug | INTRAMUSCULAR | Status: AC | PRN
  Administered 2015-11-08 (×6): 25 ug via INTRAVENOUS

## 2015-11-08 MED ORDER — KETOROLAC TROMETHAMINE 30 MG/ML IJ SOLN
30.0000 mg | Freq: Four times a day (QID) | INTRAMUSCULAR | Status: AC
  Administered 2015-11-09 – 2015-11-13 (×18): 30 mg via INTRAVENOUS
  Filled 2015-11-08 (×21): qty 1

## 2015-11-08 MED ORDER — SUCCINYLCHOLINE CHLORIDE 20 MG/ML IJ SOLN
INTRAMUSCULAR | Status: DC | PRN
  Administered 2015-11-08: 100 mg via INTRAVENOUS

## 2015-11-08 MED ORDER — DIPHENHYDRAMINE HCL 50 MG/ML IJ SOLN: 12.5000 mg | Freq: Four times a day (QID) | INTRAMUSCULAR | Status: DC | PRN

## 2015-11-08 MED ORDER — HYDROMORPHONE HCL 1 MG/ML IJ SOLN
INTRAMUSCULAR | Status: AC
  Administered 2015-11-08: 0.25 mg via INTRAVENOUS
  Filled 2015-11-08: qty 1

## 2015-11-08 SURGICAL SUPPLY — 28 items
CANISTER SUCT 1200ML W/VALVE (MISCELLANEOUS) ×3 IMPLANT
CATH TRAY 16F METER LATEX (MISCELLANEOUS) ×3 IMPLANT
CHLORAPREP W/TINT 26ML (MISCELLANEOUS) ×3 IMPLANT
DRAPE LAPAROTOMY 100X77 ABD (DRAPES) ×3 IMPLANT
DRSG OPSITE POSTOP 4X10 (GAUZE/BANDAGES/DRESSINGS) ×3 IMPLANT
DRSG TELFA 3X8 NADH (GAUZE/BANDAGES/DRESSINGS) ×3 IMPLANT
ELECT REM PT RETURN 9FT ADLT (ELECTROSURGICAL) ×3
ELECTRODE REM PT RTRN 9FT ADLT (ELECTROSURGICAL) ×1 IMPLANT
GAUZE SPONGE 4X4 12PLY STRL (GAUZE/BANDAGES/DRESSINGS) ×3 IMPLANT
GLOVE BIO SURGEON STRL SZ8 (GLOVE) ×3 IMPLANT
GOWN STRL REUS W/ TWL LRG LVL3 (GOWN DISPOSABLE) ×2 IMPLANT
GOWN STRL REUS W/TWL LRG LVL3 (GOWN DISPOSABLE) ×4
KIT RM TURNOVER STRD PROC AR (KITS) ×3 IMPLANT
LABEL OR SOLS (LABEL) ×3 IMPLANT
NDL SAFETY 22GX1.5 (NEEDLE) ×3 IMPLANT
NS IRRIG 1000ML POUR BTL (IV SOLUTION) ×3 IMPLANT
PACK BASIN MAJOR ARMC (MISCELLANEOUS) ×3 IMPLANT
PACK COLON CLEAN CLOSURE (MISCELLANEOUS) IMPLANT
SEPRAFILM MEMBRANE 5X6 (MISCELLANEOUS) ×6 IMPLANT
STAPLER SKIN PROX 35W (STAPLE) ×6 IMPLANT
SUT PDS AB 1 TP1 54 (SUTURE) ×6 IMPLANT
SUT PROLENE 0 CT 1 30 (SUTURE) IMPLANT
SUT SILK 3-0 (SUTURE) ×3 IMPLANT
SUT VIC AB 3-0 SH 27 (SUTURE)
SUT VIC AB 3-0 SH 27X BRD (SUTURE) IMPLANT
SUT VICRYL 2 0 18  UND BR (SUTURE)
SUT VICRYL 2 0 18 UND BR (SUTURE) IMPLANT
SYRINGE 10CC LL (SYRINGE) ×3 IMPLANT

## 2015-11-08 NOTE — Anesthesia Postprocedure Evaluation (Signed)
Anesthesia Post Note  Patient: Jeanella FlatterySean K Sydney  Procedure(s) Performed: Procedure(s) (LRB): EXPLORATORY LAPAROTOMY (N/A) SMALL BOWEL RESECTION Possible (N/A)  Patient location during evaluation: PACU Anesthesia Type: General Level of consciousness: awake and alert Pain management: pain level controlled Vital Signs Assessment: post-procedure vital signs reviewed and stable Respiratory status: spontaneous breathing and respiratory function stable Cardiovascular status: stable Anesthetic complications: no    Last Vitals:  Vitals:   11/08/15 1334 11/08/15 1335  BP:  126/88  Pulse: 91 92  Resp: 15 15  Temp:      Last Pain:  Vitals:   11/08/15 1335  TempSrc:   PainSc: Asleep                 Earland Reish K

## 2015-11-08 NOTE — Transfer of Care (Signed)
Immediate Anesthesia Transfer of Care Note  Patient: Nathaniel FlatterySean K Richeson  Procedure(s) Performed: Procedure(s): EXPLORATORY LAPAROTOMY (N/A) SMALL BOWEL RESECTION Possible (N/A)  Patient Location: PACU  Anesthesia Type:General  Level of Consciousness: sedated and responds to stimulation  Airway & Oxygen Therapy: Patient Spontanous Breathing and Patient connected to face mask oxygen  Post-op Assessment: Report given to RN and Post -op Vital signs reviewed and stable  Post vital signs: Reviewed and stable  Last Vitals:  Vitals:   11/08/15 1222 11/08/15 1224  BP: (!) 147/100 (!) 147/100  Pulse:  84  Resp:  10  Temp: 36.4 C     Last Pain:  Vitals:   11/08/15 1008  TempSrc: Tympanic  PainSc: 7       Patients Stated Pain Goal: 0 (11/07/15 1126)  Complications: No apparent anesthesia complications

## 2015-11-08 NOTE — Op Note (Signed)
   Pre-operative Diagnosis: Small bowel obstruction  Post-operative Diagnosis: Small bowel obstruction  Surgeon: Dionne Miloichard Josimar Corning   Assistants: PA student  Procedure: Exploratory laparotomy and lysis of adhesions  Surgeon: Adah Salvageichard E. Excell Seltzerooper, MD FACS  Anesthesia: Gen. with endotracheal tube   Procedure Details  The patient was seen again in the Holding Room. The benefits, complications, treatment options, and expected outcomes were discussed with the patient. The risks of bleeding, infection, recurrence of symptoms, failure to resolve symptoms,  bowel injury, any of which could require further surgery were reviewed with the patient.   The patient was taken to Operating Room, identified as Nathaniel Hess and the procedure verified.  A Time Out was held and the above information confirmed.  Prior to the induction of general anesthesia, antibiotic prophylaxis was administered. VTE prophylaxis was in place. General endotracheal anesthesia was then administered and tolerated well. After the induction, the abdomen was prepped with Chloraprep and draped in the sterile fashion. The patient was positioned in the supine position.  A midline incision was utilized to open and explore the abdominal cavity in so doing it was clinically identified that there were multiple large dilated small intestinal loops in the upper abdomen and collapsed loops in the lower abdomen. In elevating the bowel into the wound a soft adhesion 1 had obviously been lysed by lifting the bowel as there was an area of constriction on the small bowel itself this constriction was acute in nature and did not compromise the bowel in any way.  The bowel was run run the ligament of Treitz to the ileocecal valve and back again it was immediately noted that the majority of the ileum was in the upper abdomen and looped to the left upper quadrant and back into the pelvis at the ileocecal valve. This twist was noted and reduced back to normal  position.  This suggest that they adhesion that was causing the bowel obstruction had been present with the bowel looping around the adhesion much like an internal hernia. The lesion itself was never seen as it was lysed by lifting the bowel and was quite soft in nature in that it spontaneously lysed.  Again the small bowel was run proximal to distal distal to proximal and the area that had been adhesed was opened patent and viable and liquid material moved from the proximal bowel to the distal bowel without difficulty. At this point once assuring that hemostasis was adequate and the sponge lap needle count was correct a piece of Seprafilm was placed followed by the closure the abdominal wall with running #1 PDS. Marcaine was infiltrated into the skin and subcutaneous tissues for a total of 30 cc and then skin staples were placed.  Patient out of the procedure well the workup occasions he was taken to recovery room in stable condition to be admitted for continued care   Findings: Probable internal hernia due to adhesive disease with viable bowel   Estimated Blood Loss: Minimal         Drains: None         Specimens: None       Complications: None                  Condition: Stable   Garland Smouse E. Excell Seltzerooper, MD, FACS

## 2015-11-08 NOTE — Progress Notes (Signed)
Preoperative Review   Patient is met in the preoperative holding area. The history is reviewed in the chart and with the patient. I personally reviewed the options and rationale as well as the risks of this procedure that have been previously discussed with the patient. All questions asked by the patient and/or family were answered to their satisfaction.  Patient agrees to proceed with this procedure at this time.  Carmalita Wakefield E Lunden Stieber M.D. FACS  

## 2015-11-08 NOTE — Anesthesia Procedure Notes (Signed)
Procedure Name: Intubation Performed by: Casey BurkittHOANG, Sagar Tengan Pre-anesthesia Checklist: Patient identified, Patient being monitored, Timeout performed, Emergency Drugs available and Suction available Patient Re-evaluated:Patient Re-evaluated prior to inductionOxygen Delivery Method: Circle system utilized Preoxygenation: Pre-oxygenation with 100% oxygen Intubation Type: IV induction, Cricoid Pressure applied and Rapid sequence Laryngoscope Size: Mac and 3 Grade View: Grade I Tube type: Oral Tube size: 7.5 mm Number of attempts: 1 Airway Equipment and Method: Stylet Placement Confirmation: ETT inserted through vocal cords under direct vision,  positive ETCO2 and breath sounds checked- equal and bilateral Secured at: 22 cm Tube secured with: Tape Dental Injury: Teeth and Oropharynx as per pre-operative assessment

## 2015-11-08 NOTE — Progress Notes (Signed)
CC: Bowel obstruction Subjective: Sbo, no flatus. Mod pain. No improvement  Objective: Vital signs in last 24 hours: Temp:  [97.7 F (36.5 C)-99.3 F (37.4 C)] 99.3 F (37.4 C) (09/13 0448) Pulse Rate:  [63-98] 98 (09/13 0448) Resp:  [18-20] 18 (09/13 0448) BP: (126-143)/(72-91) 143/91 (09/13 0448) SpO2:  [98 %-100 %] 98 % (09/13 0448) Last BM Date: 11/04/15  Intake/Output from previous day: 09/12 0701 - 09/13 0700 In: 2808.3 [I.V.:2328.3; NG/GT:380; IV Piggyback:100] Out: 1450 [Urine:550; Emesis/NG output:900] Intake/Output this shift: No intake/output data recorded.  Physical exam:  Soft abd, nontender, nondistended nt calves  Lab Results: CBC   Recent Labs  11/05/15 2339 11/07/15 0435  WBC 13.9* 9.2  HGB 18.0 16.0  HCT 51.7 45.4  PLT 368 271   BMET  Recent Labs  11/05/15 2339 11/07/15 0435  NA 139 140  K 4.6 4.1  CL 102 107  CO2 23 26  GLUCOSE 177* 133*  BUN 14 12  CREATININE 1.13 1.10  CALCIUM 10.8* 8.7*   PT/INR No results for input(s): LABPROT, INR in the last 72 hours. ABG No results for input(s): PHART, HCO3 in the last 72 hours.  Invalid input(s): PCO2, PO2  Studies/Results: Dg Abd 2 Views  Result Date: 11/07/2015 CLINICAL DATA:  Small bowel obstruction. EXAM: ABDOMEN - 2 VIEW COMPARISON:  11/06/2015 and CT 11/06/2015 FINDINGS: Nasogastric tube has tip and side-port over the region of the stomach in the left upper quadrant. Examination demonstrates persistent air-filled dilated small bowel loops in the central abdomen on the supine films measuring up to 5.1 cm in diameter. Slightly less dilatation less number of dilated small bowel loops compared to the previous exam. Air is seen throughout the colon. No free peritoneal air minimal contrast within the bladder from patient's recent CT. Remainder of the exam is unchanged. IMPRESSION: Slight interval improvement of known small bowel obstruction. NG tube over the stomach. Electronically Signed    By: Elberta Fortisaniel  Boyle M.D.   On: 11/07/2015 08:46   Dg Abd Portable 1v  Result Date: 11/06/2015 CLINICAL DATA:  45 year old male with NG tube replacement. Initial encounter. EXAM: PORTABLE ABDOMEN - 1 VIEW COMPARISON:  CT Abdomen and Pelvis 0043 hours today, and earlier FINDINGS: Portable AP supine view at 1655 hours. Gaseous distension of the stomach and gas-filled abnormally dilated abdominal small bowel loops appear stable from the CT Abdomen and Pelvis earlier today. Negative lung bases. No definite pneumoperitoneum on this supine view. Stable visualized osseous structures. Gastrohepatic ligament region surgical clips re- demonstrated. IMPRESSION: 1. Enteric tube in place, side hole at the level of the proximal gastric body. 2. Stable small bowel obstruction, gastric distension. Electronically Signed   By: Odessa FlemingH  Hall M.D.   On: 11/06/2015 17:11    Anti-infectives: Anti-infectives    None      Assessment/Plan:  abd films reviewed showing no continued small bowel obstruction worsened from yesterday but about the same as it was on admission.  As discussed with the patient last night and in agreement again today I am recommending surgical intervention in this patient because he has failed medical or conservative management. I offered exploratory laparotomy with the possibility of a bowel resection the options of observation reviewed the risk of bleeding infection recurrence of bowel obstruction the potential for bowel resection and anastomotic leak were all reviewed with he and his wife they understood and agreed to proceed  Lattie Hawichard E Amelita Risinger, MD, FACS  11/08/2015

## 2015-11-08 NOTE — Anesthesia Preprocedure Evaluation (Signed)
Anesthesia Evaluation  Patient identified by MRN, date of birth, ID band Patient awake    Reviewed: Allergy & Precautions, NPO status , Patient's Chart, lab work & pertinent test results  History of Anesthesia Complications Negative for: history of anesthetic complications  Airway Mallampati: II       Dental   Pulmonary neg pulmonary ROS,           Cardiovascular negative cardio ROS       Neuro/Psych Anxiety Depression negative neurological ROS     GI/Hepatic Neg liver ROS, GERD (no problems since Nissen)  ,  Endo/Other  negative endocrine ROS  Renal/GU negative Renal ROS     Musculoskeletal negative musculoskeletal ROS (+)   Abdominal   Peds  Hematology negative hematology ROS (+)   Anesthesia Other Findings   Reproductive/Obstetrics                             Anesthesia Physical Anesthesia Plan  ASA: II and emergent  Anesthesia Plan: General   Post-op Pain Management:    Induction: Intravenous  Airway Management Planned: Oral ETT  Additional Equipment:   Intra-op Plan:   Post-operative Plan:   Informed Consent: I have reviewed the patients History and Physical, chart, labs and discussed the procedure including the risks, benefits and alternatives for the proposed anesthesia with the patient or authorized representative who has indicated his/her understanding and acceptance.     Plan Discussed with:   Anesthesia Plan Comments:         Anesthesia Quick Evaluation

## 2015-11-09 ENCOUNTER — Encounter: Payer: Self-pay | Admitting: Surgery

## 2015-11-09 NOTE — Progress Notes (Signed)
Notified Dr. Excell Seltzerooper that patient has bloody cloudy urine in catheter, will continue to monitor no new orders received.

## 2015-11-09 NOTE — Progress Notes (Signed)
1 Day Post-Op  Subjective: Feels much better after reduction of internal hernia and it easy lysis. Passing gas yet  Objective: Vital signs in last 24 hours: Temp:  [97.6 F (36.4 C)-98.5 F (36.9 C)] 97.7 F (36.5 C) (09/14 0534) Pulse Rate:  [81-108] 87 (09/14 0534) Resp:  [8-20] 20 (09/14 0534) BP: (120-147)/(77-100) 123/79 (09/14 0534) SpO2:  [90 %-100 %] 100 % (09/14 0534) Weight:  [203 lb (92.1 kg)] 203 lb (92.1 kg) (09/13 1008) Last BM Date: 11/01/15  Intake/Output from previous day: 09/13 0701 - 09/14 0700 In: 2808.3 [P.O.:30; I.V.:2728.3; IV Piggyback:50] Out: 2920 [Urine:225; Emesis/NG output:2075; Blood:20] Intake/Output this shift: Total I/O In: 67 [I.V.:67] Out: 0   Physical exam:  Is clean abdomen is soft and nontender distended. Calves are nontender  Lab Results: CBC   Recent Labs  11/07/15 0435  WBC 9.2  HGB 16.0  HCT 45.4  PLT 271   BMET  Recent Labs  11/07/15 0435  NA 140  K 4.1  CL 107  CO2 26  GLUCOSE 133*  BUN 12  CREATININE 1.10  CALCIUM 8.7*   PT/INR No results for input(s): LABPROT, INR in the last 72 hours. ABG No results for input(s): PHART, HCO3 in the last 72 hours.  Invalid input(s): PCO2, PO2  Studies/Results: Dg Abd 2 Views  Result Date: 11/08/2015 CLINICAL DATA:  Small bowel obstruction. EXAM: ABDOMEN - 2 VIEW COMPARISON:  11/07/2015 and 11/06/2015 FINDINGS: Interval removal of nasogastric tube. Examination demonstrates persistent centralized air-filled dilated small bowel loops measuring 5.3 cm in diameter without significant change compared to 11/06/2015. Mild gaseous distention of the stomach without significant change. Minimal air throughout the colon. No evidence of free peritoneal air. Remainder of the exam is unchanged. IMPRESSION: Evidence of small bowel obstruction without significant change compared to 11/06/2015. Interval removal of nasogastric tube. Electronically Signed   By: Elberta Fortisaniel  Boyle M.D.   On:  11/08/2015 08:20   Dg Abd 2 Views  Result Date: 11/07/2015 CLINICAL DATA:  Small bowel obstruction. EXAM: ABDOMEN - 2 VIEW COMPARISON:  11/06/2015 and CT 11/06/2015 FINDINGS: Nasogastric tube has tip and side-port over the region of the stomach in the left upper quadrant. Examination demonstrates persistent air-filled dilated small bowel loops in the central abdomen on the supine films measuring up to 5.1 cm in diameter. Slightly less dilatation less number of dilated small bowel loops compared to the previous exam. Air is seen throughout the colon. No free peritoneal air minimal contrast within the bladder from patient's recent CT. Remainder of the exam is unchanged. IMPRESSION: Slight interval improvement of known small bowel obstruction. NG tube over the stomach. Electronically Signed   By: Elberta Fortisaniel  Boyle M.D.   On: 11/07/2015 08:46    Anti-infectives: Anti-infectives    Start     Dose/Rate Route Frequency Ordered Stop   11/08/15 1028  ceFAZolin (ANCEF) 2-4 GM/100ML-% IVPB    Comments:  JOYCE, HEATHER: cabinet override      11/08/15 1028 11/08/15 1120   11/08/15 1021  ceFAZolin (ANCEF) IVPB 2g/100 mL premix     2 g 200 mL/hr over 30 Minutes Intravenous 30 min pre-op 11/08/15 1021 11/08/15 1120      Assessment/Plan: s/p Procedure(s): EXPLORATORY LAPAROTOMY SMALL BOWEL RESECTION Possible   Patient is doing well continue nasogastric tube at this point may be able to decrease IV fluids later today.  Lattie Hawichard E Weltha Cathy, MD, FACS  11/09/2015

## 2015-11-10 LAB — CBC WITH DIFFERENTIAL/PLATELET
BASOS ABS: 0 10*3/uL (ref 0–0.1)
BASOS PCT: 1 %
EOS ABS: 0.7 10*3/uL (ref 0–0.7)
EOS PCT: 8 %
HCT: 42.3 % (ref 40.0–52.0)
Hemoglobin: 14.9 g/dL (ref 13.0–18.0)
LYMPHS PCT: 16 %
Lymphs Abs: 1.4 10*3/uL (ref 1.0–3.6)
MCH: 30.7 pg (ref 26.0–34.0)
MCHC: 35.3 g/dL (ref 32.0–36.0)
MCV: 87.1 fL (ref 80.0–100.0)
Monocytes Absolute: 1.1 10*3/uL — ABNORMAL HIGH (ref 0.2–1.0)
Monocytes Relative: 12 %
Neutro Abs: 5.8 10*3/uL (ref 1.4–6.5)
Neutrophils Relative %: 63 %
PLATELETS: 240 10*3/uL (ref 150–440)
RBC: 4.86 MIL/uL (ref 4.40–5.90)
RDW: 13.3 % (ref 11.5–14.5)
WBC: 9.1 10*3/uL (ref 3.8–10.6)

## 2015-11-10 LAB — BASIC METABOLIC PANEL
ANION GAP: 6 (ref 5–15)
BUN: 11 mg/dL (ref 6–20)
CO2: 32 mmol/L (ref 22–32)
Calcium: 8.6 mg/dL — ABNORMAL LOW (ref 8.9–10.3)
Chloride: 100 mmol/L — ABNORMAL LOW (ref 101–111)
Creatinine, Ser: 1.03 mg/dL (ref 0.61–1.24)
Glucose, Bld: 112 mg/dL — ABNORMAL HIGH (ref 65–99)
POTASSIUM: 3.5 mmol/L (ref 3.5–5.1)
SODIUM: 138 mmol/L (ref 135–145)

## 2015-11-10 MED ORDER — HYDROMORPHONE HCL 1 MG/ML IJ SOLN
1.0000 mg | INTRAMUSCULAR | Status: DC | PRN
  Administered 2015-11-10 – 2015-11-16 (×17): 1 mg via INTRAVENOUS
  Filled 2015-11-10 (×18): qty 1

## 2015-11-10 NOTE — Progress Notes (Signed)
2 Days Post-Op  Subjective: Status post exploratory laparotomy for small bowel obstruction. Patient's feeling much better today is passing gas but he has a high NG output which is pure bile  Objective: Vital signs in last 24 hours: Temp:  [97.7 F (36.5 C)-98.4 F (36.9 C)] 98 F (36.7 C) (09/15 0435) Pulse Rate:  [83-98] 87 (09/15 0435) Resp:  [9-18] 18 (09/15 0435) BP: (123-139)/(75-80) 135/78 (09/15 0435) SpO2:  [94 %-99 %] 97 % (09/15 0435) FiO2 (%):  [98 %] 98 % (09/14 1920) Last BM Date: 11/01/15  Intake/Output from previous day: 09/14 0701 - 09/15 0700 In: 1409 [I.V.:1349; NG/GT:60] Out: 5250 [Urine:650; Emesis/NG output:4600] Intake/Output this shift: No intake/output data recorded.  Physical exam:  Abdomen is soft nondistended nontympanitic and nontender clean with honeycomb dressing. Nontender calves NG output is recorded as 4500 cc in 24 hours and is pure bile.  Lab Results: CBC   Recent Labs  11/10/15 0538  WBC 9.1  HGB 14.9  HCT 42.3  PLT 240   BMET  Recent Labs  11/10/15 0538  NA 138  K 3.5  CL 100*  CO2 32  GLUCOSE 112*  BUN 11  CREATININE 1.03  CALCIUM 8.6*   PT/INR No results for input(s): LABPROT, INR in the last 72 hours. ABG No results for input(s): PHART, HCO3 in the last 72 hours.  Invalid input(s): PCO2, PO2  Studies/Results: No results found.  Anti-infectives: Anti-infectives    Start     Dose/Rate Route Frequency Ordered Stop   11/08/15 1028  ceFAZolin (ANCEF) 2-4 GM/100ML-% IVPB    Comments:  JOYCE, HEATHER: cabinet override      11/08/15 1028 11/08/15 1120   11/08/15 1021  ceFAZolin (ANCEF) IVPB 2g/100 mL premix     2 g 200 mL/hr over 30 Minutes Intravenous 30 min pre-op 11/08/15 1021 11/08/15 1120      Assessment/Plan: s/p Procedure(s): EXPLORATORY LAPAROTOMY SMALL BOWEL RESECTION Possible   High NG output following exploratory laparotomy for small bowel obstruction he is passing gas and feels well and the NG  output is pure bilious. I suspect the nasogastric tube is in the duodenum and is removing hepatobiliary and pancreatic secretions. We'll discontinue the nasogastric tube and start clear liquids at this point.  Lattie Hawichard E Dao Memmott, MD, FACS  11/10/2015

## 2015-11-11 ENCOUNTER — Inpatient Hospital Stay: Payer: BLUE CROSS/BLUE SHIELD

## 2015-11-11 MED ORDER — ONDANSETRON HCL 4 MG/2ML IJ SOLN: 4.0000 mg | Freq: Four times a day (QID) | INTRAMUSCULAR | Status: DC | PRN

## 2015-11-11 MED ORDER — ONDANSETRON HCL 4 MG/2ML IJ SOLN
4.0000 mg | INTRAMUSCULAR | Status: DC | PRN
  Administered 2015-11-11 (×4): 4 mg via INTRAVENOUS
  Filled 2015-11-11 (×2): qty 2

## 2015-11-11 MED ORDER — DEXTROSE-NACL 5-0.45 % IV SOLN: INTRAVENOUS | Status: DC

## 2015-11-11 MED ORDER — ONDANSETRON 4 MG PO TBDP: 4.0000 mg | ORAL_TABLET | ORAL | Status: DC | PRN

## 2015-11-11 MED ORDER — KCL IN DEXTROSE-NACL 20-5-0.45 MEQ/L-%-% IV SOLN
INTRAVENOUS | Status: DC
  Administered 2015-11-11 – 2015-11-13 (×5): via INTRAVENOUS
  Filled 2015-11-11 (×8): qty 1000

## 2015-11-11 NOTE — Progress Notes (Signed)
Per Dr. Excell Seltzerooper place NG tube in patient to low intermittent suction

## 2015-11-11 NOTE — Progress Notes (Signed)
Patient feels much better with nasogastric tube in place large amount of gastric secretions removed. We'll leave NG tube in place for now. They have ice chips

## 2015-11-11 NOTE — Plan of Care (Signed)
Problem: Bowel/Gastric: Goal: Will not experience complications related to bowel motility Outcome: Progressing Pt had small BM and still passing gas, but pt still has persistent nausea and abdominal pain

## 2015-11-11 NOTE — Progress Notes (Signed)
Per Dr. Excell Seltzerooper hang Dextrose % in 0.45% NS with potassium at 125. Discontinue other fluid orders.

## 2015-11-11 NOTE — Progress Notes (Signed)
Dr. Everlene FarrierPabon notified of pt persistent nausea and dry-heaves. Received orders to increase frequency of zofran to q4h prn. And continue to monitor.

## 2015-11-11 NOTE — Progress Notes (Signed)
3 Days Post-Op  Subjective: Patient feels poorly today he states last night he started feeling like he did when he came to the hospital and was heaving he does not vomit due to his prior Nissen. It is not well this morning. He is passing gas and had a bowel movement. Objective: Vital signs in last 24 hours: Temp:  [97.9 F (36.6 C)-98.4 F (36.9 C)] 98.4 F (36.9 C) (09/16 0422) Pulse Rate:  [81-100] 81 (09/16 0422) Resp:  [16-22] 16 (09/16 0422) BP: (98-157)/(56-107) 157/87 (09/16 0422) SpO2:  [97 %-100 %] 97 % (09/16 0422) Last BM Date: 11/01/15  Intake/Output from previous day: 09/15 0701 - 09/16 0700 In: 2427 [I.V.:2427] Out: 405 [Urine:205; Emesis/NG output:200] Intake/Output this shift: Total I/O In: 208 [I.V.:208] Out: 0   Physical exam:  Abdomen is soft and minimally distended minimally tympanitic nontender wound is clean calves are nontender  Lab Results: CBC   Recent Labs  11/10/15 0538  WBC 9.1  HGB 14.9  HCT 42.3  PLT 240   BMET  Recent Labs  11/10/15 0538  NA 138  K 3.5  CL 100*  CO2 32  GLUCOSE 112*  BUN 11  CREATININE 1.03  CALCIUM 8.6*   PT/INR No results for input(s): LABPROT, INR in the last 72 hours. ABG No results for input(s): PHART, HCO3 in the last 72 hours.  Invalid input(s): PCO2, PO2  Studies/Results: No results found.  Anti-infectives: Anti-infectives    Start     Dose/Rate Route Frequency Ordered Stop   11/08/15 1028  ceFAZolin (ANCEF) 2-4 GM/100ML-% IVPB    Comments:  JOYCE, HEATHER: cabinet override      11/08/15 1028 11/08/15 1120   11/08/15 1021  ceFAZolin (ANCEF) IVPB 2g/100 mL premix     2 g 200 mL/hr over 30 Minutes Intravenous 30 min pre-op 11/08/15 1021 11/08/15 1120      Assessment/Plan: s/p Procedure(s): EXPLORATORY LAPAROTOMY SMALL BOWEL RESECTION Possible   Patient had nasogastric tube removed yesterday and is experiencing similar symptoms to what he had before surgery. But he is passing gas and  having bowel movements. Without a mind I will order a KUB to identify any continued ileus etc. he may require nasogastric replacement if he is this uncomfortable.  Lattie Hawichard E Camreigh Michie, MD, FACS  11/11/2015

## 2015-11-12 ENCOUNTER — Inpatient Hospital Stay: Payer: BLUE CROSS/BLUE SHIELD

## 2015-11-12 MED ORDER — LACTATED RINGERS IV BOLUS (SEPSIS)
1000.0000 mL | Freq: Once | INTRAVENOUS | Status: AC
  Administered 2015-11-12: 1000 mL via INTRAVENOUS

## 2015-11-12 NOTE — Progress Notes (Signed)
4 Days Post-Op  Subjective: Patient feels much better with nasogastric tube in and has had a large volume removed via the nasogastric suction. He is passing gas and had a bowel movement. Any abdominal pain  Objective: Vital signs in last 24 hours: Temp:  [98.2 F (36.8 C)-98.3 F (36.8 C)] 98.3 F (36.8 C) (09/17 0553) Pulse Rate:  [67-81] 67 (09/17 0553) Resp:  [16-18] 16 (09/17 0553) BP: (105-139)/(60-83) 105/60 (09/17 0553) SpO2:  [95 %-98 %] 96 % (09/17 0553) Last BM Date: 11/10/15  Intake/Output from previous day: 09/16 0701 - 09/17 0700 In: 1397 [I.V.:1317; NG/GT:30; IV Piggyback:50] Out: 5675 [Urine:325; Emesis/NG output:5350] Intake/Output this shift: No intake/output data recorded.  Physical exam:  Awake alert and oriented Vital signs reviewed and stable Abdomen less distended and nontympanitic nontender wound is clean. Nontender calves   Lab Results: CBC   Recent Labs  11/10/15 0538  WBC 9.1  HGB 14.9  HCT 42.3  PLT 240   BMET  Recent Labs  11/10/15 0538  NA 138  K 3.5  CL 100*  CO2 32  GLUCOSE 112*  BUN 11  CREATININE 1.03  CALCIUM 8.6*   PT/INR No results for input(s): LABPROT, INR in the last 72 hours. ABG No results for input(s): PHART, HCO3 in the last 72 hours.  Invalid input(s): PCO2, PO2  Studies/Results: Dg Abd 2 Views  Result Date: 11/11/2015 CLINICAL DATA:  SBO, 3 days post op. Pt states this am he is feeling as bad as he did when he came in the ed. Pt is heaving but not vomiting. EXAM: ABDOMEN - 2 VIEW COMPARISON:  11/08/2015 FINDINGS: Stomach is significantly distended. There is dilated small bowel with mostly decompressed colon. There are multiple air-fluid level cyst. Midline abdominal staples are noted, the since the prior radiographs. No free air. IMPRESSION: 1. Findings suggest a recurrent partial small bowel obstruction, with small bowel dilation, multiple air-fluid levels and significant gastric distention. Colon is mostly  decompressed. 2. No free air. Electronically Signed   By: Amie Portlandavid  Ormond M.D.   On: 11/11/2015 09:58    Anti-infectives: Anti-infectives    Start     Dose/Rate Route Frequency Ordered Stop   11/08/15 1028  ceFAZolin (ANCEF) 2-4 GM/100ML-% IVPB    Comments:  JOYCE, HEATHER: cabinet override      11/08/15 1028 11/08/15 1120   11/08/15 1021  ceFAZolin (ANCEF) IVPB 2g/100 mL premix     2 g 200 mL/hr over 30 Minutes Intravenous 30 min pre-op 11/08/15 1021 11/08/15 1120      Assessment/Plan: s/p Procedure(s): EXPLORATORY LAPAROTOMY SMALL BOWEL RESECTION Possible  High NG output. KUB is reviewed showing multiple dilated proximal loops but there is gas in the right colon and rectum with a large stool burden. Patient is having bowel movements and passing gas. We'll continue to observe on NG suction  Lattie Hawichard E Kimley Apsey, MD, [facility]  11/12/2015

## 2015-11-12 NOTE — Progress Notes (Signed)
Patient feels about the same he is passing gas NG output remains fairly high. We'll review fluid management to ensure that he does not become dehydrated. Continue NG suction tonight films in the morning

## 2015-11-13 ENCOUNTER — Inpatient Hospital Stay: Payer: BLUE CROSS/BLUE SHIELD

## 2015-11-13 DIAGNOSIS — T43205A Adverse effect of unspecified antidepressants, initial encounter: Secondary | ICD-10-CM

## 2015-11-13 DIAGNOSIS — F411 Generalized anxiety disorder: Secondary | ICD-10-CM

## 2015-11-13 LAB — BASIC METABOLIC PANEL
ANION GAP: 10 (ref 5–15)
BUN: 18 mg/dL (ref 6–20)
CALCIUM: 8.5 mg/dL — AB (ref 8.9–10.3)
CO2: 34 mmol/L — AB (ref 22–32)
CREATININE: 1.05 mg/dL (ref 0.61–1.24)
Chloride: 96 mmol/L — ABNORMAL LOW (ref 101–111)
GFR calc Af Amer: >60 mL/min (ref >60)
Glucose, Bld: 108 mg/dL — ABNORMAL HIGH (ref 65–99)
Potassium: 3 mmol/L — ABNORMAL LOW (ref 3.5–5.1)
Sodium: 140 mmol/L (ref 135–145)

## 2015-11-13 LAB — CBC WITH DIFFERENTIAL/PLATELET
BASOS ABS: 0.1 10*3/uL (ref 0–0.1)
Basophils Relative: 1 %
Eosinophils Absolute: 0.9 10*3/uL — ABNORMAL HIGH (ref 0–0.7)
Eosinophils Relative: 12 %
HEMATOCRIT: 41.3 % (ref 40.0–52.0)
Hemoglobin: 14.6 g/dL (ref 13.0–18.0)
LYMPHS ABS: 2.2 10*3/uL (ref 1.0–3.6)
LYMPHS PCT: 28 %
MCH: 30.4 pg (ref 26.0–34.0)
MCHC: 35.4 g/dL (ref 32.0–36.0)
MCV: 86 fL (ref 80.0–100.0)
MONO ABS: 0.9 10*3/uL (ref 0.2–1.0)
MONOS PCT: 12 %
NEUTROS ABS: 3.5 10*3/uL (ref 1.4–6.5)
Neutrophils Relative %: 47 %
Platelets: 320 10*3/uL (ref 150–440)
RBC: 4.8 MIL/uL (ref 4.40–5.90)
RDW: 13.3 % (ref 11.5–14.5)
WBC: 7.6 10*3/uL (ref 3.8–10.6)

## 2015-11-13 MED ORDER — LORAZEPAM 2 MG/ML IJ SOLN
1.0000 mg | Freq: Four times a day (QID) | INTRAMUSCULAR | Status: DC | PRN
  Administered 2015-11-13: 1 mg via INTRAVENOUS
  Filled 2015-11-13: qty 1

## 2015-11-13 MED ORDER — KCL IN DEXTROSE-NACL 40-5-0.45 MEQ/L-%-% IV SOLN
INTRAVENOUS | Status: DC
  Administered 2015-11-13 – 2015-11-14 (×3): via INTRAVENOUS
  Administered 2015-11-14: 1000 mL via INTRAVENOUS
  Filled 2015-11-13 (×6): qty 1000

## 2015-11-13 MED ORDER — MIRTAZAPINE 15 MG PO TBDP
15.0000 mg | ORAL_TABLET | Freq: Every day | ORAL | Status: DC
  Administered 2015-11-13: 15 mg via ORAL
  Filled 2015-11-13: qty 1

## 2015-11-13 MED ORDER — LORAZEPAM 2 MG/ML IJ SOLN: 1.0000 mg | Freq: Once | INTRAMUSCULAR | Status: DC

## 2015-11-13 MED ORDER — LORAZEPAM 2 MG/ML IJ SOLN
2.0000 mg | INTRAMUSCULAR | Status: DC | PRN
  Administered 2015-11-13 – 2015-11-14 (×4): 2 mg via INTRAVENOUS
  Filled 2015-11-13 (×6): qty 1

## 2015-11-13 NOTE — Consult Note (Signed)
[facility] Consult   Reason for Consult:  Consult for 45 year old man with a history of depression and anxiety currently in the hospital for abdominal surgery Referring Physician:  Pat Patrick Patient Identification: Nathaniel Hess MRN:  [id number] Principal Diagnosis: Generalized anxiety disorder Diagnosis:   Patient Active Problem List   Diagnosis Date Noted  . Generalized anxiety disorder [F41.1] 11/13/2015  . Serotonin withdrawal syndrome ([place]) [T50.995A] 11/13/2015  . SBO (small bowel obstruction) ([facility]) [K56.69] 11/06/2015    Total Time spent with patient: 1 hour  Subjective:   Nathaniel Hess is a 45 y.o. male patient admitted with "I'm pretty frustrated".  HPI:  Patient interviewed. Got to speak with his wife as well. Chart reviewed. This is a 49 year old man who is currently in the hospital for abdominal surgery. Had a small bowel obstruction that was relieved surgically. He has continued to have symptoms typical of an ileus for several days afterwards. He has failed withdrawal of nasogastric suction. Patient is unable to take any oral medication as a result and has not had his antidepressant medicine or Xanax. He is growing increasingly frustrated and irritated at his medical care. Patient took advantage of our interview to discuss a wide range of frustrations in his life including long-standing financial worries, frustrations with the way that his wife keeps house, lingering resentment towards his parents, lingering shame and issues regarding his parent's illnesses as well as his anxiety around his own current physical state. Prior to hospitalization he was taking Xanax at the low dose of 0.25 mg once a day, Cymbalta 60 mg a day and buspirone either 5 or 15 mg once a day. Since being in the hospital he has received intravenous Ativan and is now receiving doses as high as 2 mg every 6 hours and yet remains very anxious. Today he spoke with Dr. Pat Patrick at some length and yet still  seems to be perplexed by his condition. He discussed with meal wide range of gastrointestinal diseases that he wanted to have ruled out. He is not sleeping well. He feels depressed. He has had some hopelessness and passive suicidality although without any intent or plan. He is not having any psychotic symptoms or hallucinations. He feels that the Ativan is not providing significant relief.  Social history: Patient is married has one daughter who lives at home. He works as an Clinical biochemist. Obviously he is earning no money while he is in the hospital. Wife is on disability. There were clearly a lot of financial worries and resentments that he openly discussed in front of his wife.  Medical history: He is currently in the hospital for relief of the small bowel obstruction. Having postoperative ileus.  Substance abuse history: Denies alcohol or drug abuse  Past Psychiatric History: Was seeing Dr. Lovie Macadamia for outpatient psychiatric treatment. No history of suicide attempts. No history of hospitalization. Sounds like he had had at least partial response to Cymbalta and BuSpar and low doses of Xanax.  Risk to Self: Is patient at risk for suicide?: No Risk to Others:   Prior Inpatient Therapy:   Prior Outpatient Therapy:    Past Medical History:  Past Medical History:  Diagnosis Date  . Barrett's esophagus     Past Surgical History:  Procedure Laterality Date  . BOWEL RESECTION N/A 11/08/2015   Procedure: SMALL BOWEL RESECTION Possible;  Surgeon: Florene Glen, MD;  Location: [facility] ORS;  Service: General;  Laterality: N/A;  . ESOPHAGUS SURGERY    . LAPAROTOMY N/A  11/08/2015   Procedure: EXPLORATORY LAPAROTOMY;  Surgeon: Florene Glen, MD;  Location: [facility] ORS;  Service: General;  Laterality: N/A;  . NISSEN FUNDOPLICATION     Family History: History reviewed. No pertinent family history. Family Psychiatric  History: Sounds like his father had an alcohol problem Social History:  History    Alcohol Use  . 1.8 oz/week  . 3 Cans of beer per week     History  Drug Use No    Social History   Social History  . Marital status: Married    Spouse name: N/A  . Number of children: N/A  . Years of education: N/A   Social History Main Topics  . Smoking status: Never Smoker  . Smokeless tobacco: Never Used  . Alcohol use 1.8 oz/week    3 Cans of beer per week  . Drug use: No  . Sexual activity: Not Asked   Other Topics Concern  . None   Social History Narrative  . None   Additional Social History:    Allergies:  No Known Allergies  Labs:  Results for orders placed or performed during the hospital encounter of 11/05/15 (from the past 48 hour(s))  CBC with Differential/Platelet     Status: Abnormal   Collection Time: 11/13/15  5:34 AM  Result Value Ref Range   WBC 7.6 3.8 - 10.6 K/uL   RBC 4.80 4.40 - 5.90 MIL/uL   Hemoglobin 14.6 13.0 - 18.0 g/dL   HCT 41.3 40.0 - 52.0 %   MCV 86.0 80.0 - 100.0 fL   MCH 30.4 26.0 - 34.0 pg   MCHC 35.4 32.0 - 36.0 g/dL   RDW 13.3 11.5 - 14.5 %   Platelets 320 150 - 440 K/uL   Neutrophils Relative % 47 %   Neutro Abs 3.5 1.4 - 6.5 K/uL   Lymphocytes Relative 28 %   Lymphs Abs 2.2 1.0 - 3.6 K/uL   Monocytes Relative 12 %   Monocytes Absolute 0.9 0.2 - 1.0 K/uL   Eosinophils Relative 12 %   Eosinophils Absolute 0.9 (H) 0 - 0.7 K/uL   Basophils Relative 1 %   Basophils Absolute 0.1 0 - 0.1 K/uL  Basic metabolic panel     Status: Abnormal   Collection Time: 11/13/15  5:34 AM  Result Value Ref Range   Sodium 140 135 - 145 mmol/L   Potassium 3.0 (L) 3.5 - 5.1 mmol/L   Chloride 96 (L) 101 - 111 mmol/L   CO2 34 (H) 22 - 32 mmol/L   Glucose, Bld 108 (H) 65 - 99 mg/dL   BUN 18 6 - 20 mg/dL   Creatinine, Ser 1.05 0.61 - 1.24 mg/dL   Calcium 8.5 (L) 8.9 - 10.3 mg/dL   GFR calc non Af Amer >60 >60 mL/min   GFR calc Af Amer >60 >60 mL/min    Comment: (NOTE) The eGFR has been calculated using the CKD EPI equation. This  calculation has not been validated in all clinical situations. eGFR's persistently <60 mL/min signify possible Chronic Kidney Disease.    Anion gap 10 5 - 15    Current Facility-Administered Medications  Medication Dose Route Frequency Provider Last Rate Last Dose  . dextrose 5 % and 0.45 % NaCl with KCl 40 mEq/L infusion   Intravenous Continuous Dia Crawford III, MD 150 mL/hr at 11/13/15 1312    . enoxaparin (LOVENOX) injection 40 mg  40 mg Subcutaneous Q24H Dia Crawford III, MD   40 mg at  11/13/15 4010  . famotidine (PEPCID) IVPB 20 mg premix  20 mg Intravenous Q12H Dia Crawford III, MD   20 mg at 11/13/15 0955  . HYDROmorphone (DILAUDID) injection 1 mg  1 mg Intravenous Q3H PRN Hubbard Robinson, MD   1 mg at 11/13/15 1111  . ketorolac (TORADOL) 30 MG/ML injection 30 mg  30 mg Intravenous Q6H Hubbard Robinson, MD   30 mg at 11/13/15 1720  . LORazepam (ATIVAN) injection 2 mg  2 mg Intravenous Q4H PRN Dia Crawford III, MD   2 mg at 11/13/15 1252  . menthol-cetylpyridinium (CEPACOL) lozenge 3 mg  1 lozenge Oral PRN Dia Crawford III, MD   3 mg at 11/06/15 1548  . mirtazapine (REMERON SOL-TAB) disintegrating tablet 15 mg  15 mg Oral QHS Gonzella Lex, MD      . ondansetron [facility]) injection 4 mg  4 mg Intravenous Q4H PRN Jules Husbands, MD   4 mg at 11/11/15 2030   Or  . ondansetron (ZOFRAN-ODT) disintegrating tablet 4 mg  4 mg Oral Q4H PRN Jules Husbands, MD        Musculoskeletal: Strength & Muscle Tone: within normal limits Gait & Station: unsteady Patient leans: N/A  Psychiatric Specialty Exam: Physical Exam  Nursing note and vitals reviewed. Constitutional: He appears well-developed and well-nourished.  HENT:  Head: Normocephalic and atraumatic.  Eyes: Conjunctivae are normal. Pupils are equal, round, and reactive to light.  Neck: Normal range of motion.  Cardiovascular: Regular rhythm and normal heart sounds.   Respiratory: Effort normal.  GI:    Musculoskeletal: Normal range of  motion.  Neurological: He is alert.  Skin: Skin is warm and dry.  Psychiatric: His speech is normal. His mood appears anxious. He is agitated. Thought content is not paranoid. Cognition and memory are normal. He expresses impulsivity. He expresses no homicidal and no suicidal ideation.    Review of Systems  Constitutional: Positive for malaise/fatigue.  HENT: Negative.   Eyes: Negative.   Respiratory: Negative.   Cardiovascular: Negative.   Gastrointestinal: Positive for abdominal pain and nausea.  Musculoskeletal: Negative.   Skin: Negative.   Neurological: Negative.   Psychiatric/Behavioral: Positive for depression and suicidal ideas. Negative for hallucinations, memory loss and substance abuse. The patient is nervous/anxious and has insomnia.     Blood pressure 119/77, pulse 61, temperature 97.6 F (36.4 C), temperature source Oral, resp. rate 16, height 5' 10"  (1.778 m), weight 92.1 kg (203 lb), SpO2 98 %.Body mass index is 29.13 kg/m.  General Appearance: Casual  Eye Contact:  Good  Speech:  Clear and Coherent  Volume:  Increased  Mood:  Angry and Irritable  Affect:  Congruent  Thought Process:  Goal Directed  Orientation:  Full (Time, Place, and Person)  Thought Content:  Logical and Rumination  Suicidal Thoughts:  Yes.  without intent/plan  Homicidal Thoughts:  No  Memory:  Immediate;   Good Recent;   Good Remote;   Fair  Judgement:  Fair  Insight:  Fair  Psychomotor Activity:  Decreased  Concentration:  Concentration: Fair  Recall:  [employer] of Knowledge:  Fair  Language:  Fair  Akathisia:  No  Handed:  Right  AIMS (if indicated):     Assets:  Communication Skills Desire for Improvement Housing Resilience Social Support  ADL's:  Impaired  Cognition:  WNL  Sleep:        Treatment Plan Summary: Daily contact with patient to assess and evaluate symptoms and  progress in treatment, Medication management and Plan This is a 45 year old man who clearly has a  lot of anxiety and all of that is being worsened by his current hospital stay. He is getting very upset and agitated at his medical condition even though as far as I can tell there is nothing remarkably unusual about it. He is clearly suffering from being off of his antidepressant medication. Xanax is chemically different than Ativan in that it provides serotonergic effect as well and so there may be something to his feeling that the Ativan is not working as well as the Xanax. Some of that however may be situational as well. I have searched through our formulary and through what is currently available in the [place] and unfortunately it does not seem that there is any intravenous antidepressant that can be provided. It seems like at one time citalopram was available and I know that Tofranil used to be available as intravenous but neither of them seem widely available anymore and are certainly not in our formulary. I am going to try giving him a 15 mg mirtazapine orally dissolving tablet to try taking sublingually. Mirtazapine ODT tablets are not really meant to be absorbed through the sublingual membrane but at least it does not require swallowing and there may be some absorption through that mechanism. Otherwise I don't see any other good way to get him the serotonergic medicine right now. I don't think there is probably much use increasing the Ativan intravenous. He is already on enough that it should be controlling anxiety. If this is not working nothing more is probably going to help. I will try and follow-up with him regularly. I expect that the patient is going to get more and more frustrated as long as he is unable to swallow. I don't think he needs a sitter and I don't think he is an active risk to harm himself in the hospital.  Disposition: Patient does not meet criteria for psychiatric inpatient admission. Supportive therapy provided about ongoing stressors.  Alethia Berthold, MD 11/13/2015 8:36 PM

## 2015-11-13 NOTE — Progress Notes (Signed)
Subjective:   He is very upset with many questions about his care feeling as though he is making no progress. His wife was present for the interview and both were quite aggressive in discussing his failure to thrive. He denies any significant nausea. He's not had any flatus in 2 days. He is not having any bowel function presently. His pain is minimal. He is having significant depressive symptoms as his normal medications have been discontinued. Plain films today show persistent small bowel obstruction versus an ileus. They do not show any improvement. His electrolytes show potassium of 3.0.  Vital signs in last 24 hours: Temp:  [98.1 F (36.7 C)-98.4 F (36.9 C)] 98.4 F (36.9 C) (09/18 0440) Pulse Rate:  [61-93] 61 (09/18 0440) Resp:  [17-18] 17 (09/18 0440) BP: (121-126)/(75-78) 122/75 (09/18 0440) SpO2:  [95 %-99 %] 98 % (09/18 0440) Last BM Date: 11/10/15  Intake/Output from previous day: 09/17 0701 - 09/18 0700 In: 2543.8 [I.V.:2543.8] Out: 4750 [Urine:300; Emesis/NG output:4450]  Exam:  His abdomen is soft with pretty good bowel sounds no significant abdominal tenderness and no drainage from the wound. His lungs are clear.  Lab Results:  CBC  Recent Labs  11/13/15 0534  WBC 7.6  HGB 14.6  HCT 41.3  PLT 320   CMP     Component Value Date/Time   NA 140 11/13/2015 0534   NA 144 01/15/2014 1848   K 3.0 (L) 11/13/2015 0534   K 3.7 01/15/2014 1848   CL 96 (L) 11/13/2015 0534   CL 108 (H) 01/15/2014 1848   CO2 34 (H) 11/13/2015 0534   CO2 23 01/15/2014 1848   GLUCOSE 108 (H) 11/13/2015 0534   GLUCOSE 111 (H) 01/15/2014 1848   BUN 18 11/13/2015 0534   BUN 10 01/15/2014 1848   CREATININE 1.05 11/13/2015 0534   CREATININE 1.30 01/15/2014 1848   CALCIUM 8.5 (L) 11/13/2015 0534   CALCIUM 8.5 01/15/2014 1848   PROT 8.5 (H) 11/05/2015 2339   PROT 7.1 01/15/2014 1848   ALBUMIN 5.1 (H) 11/05/2015 2339   ALBUMIN 3.4 01/15/2014 1848   AST 47 (H) 11/05/2015 2339   AST 33  01/15/2014 1848   ALT 38 11/05/2015 2339   ALT 41 01/15/2014 1848   ALKPHOS 89 11/05/2015 2339   ALKPHOS 89 01/15/2014 1848   BILITOT 1.2 11/05/2015 2339   BILITOT 0.4 01/15/2014 1848   GFRNONAA >60 11/13/2015 0534   GFRNONAA >60 01/15/2014 1848   GFRAA >60 11/13/2015 0534   GFRAA >60 01/15/2014 1848   PT/INR No results for input(s): LABPROT, INR in the last 72 hours.  Studies/Results: Dg Abd 2 Views  Result Date: 11/13/2015 CLINICAL DATA:  Follow-up small bowel obstruction EXAM: ABDOMEN - 2 VIEW COMPARISON:  In the 11/11/2005, 11/10/2005, and CT abdomen pelvis of 11/06/2015 FINDINGS: There are persistently dilated loops of small bowel with air-fluid levels consistent with persistent partial small bowel obstruction. NGT remains with the tip in the distal body of the stomach. Surgical staples are noted across the midline of the lower abdomen and upper pelvis. No free air is seen on the erect view. IMPRESSION: Persistent partial small bowel obstruction. NG tube tip in the distal body of stomach Electronically Signed   By: Dwyane Dee M.D.   On: 11/13/2015 08:38   Dg Abd 2 Views  Result Date: 11/12/2015 CLINICAL DATA:  Pt hospitalized x 1 week with SBO, pt reports having surgery last Thursday, new NG tube placed yesterday. EXAM: ABDOMEN - 2 VIEW  COMPARISON:  Radiograph 11/11/2015, CT 11/06/2015 FINDINGS: Interval placement NG tube with side port below the diaphragms. There is interval evacuation of a large volume of gas within the stomach as well fluid. Dilated loops of small bowel do remain LEFT upper quadrant measuring up to 3.3 cm. No air-fluid levels. There is gas in the descending colon rectum. IMPRESSION: 1. Marked improvement in small bowel obstruction pattern following NG tube placement. Recommend follow-up radiographs. 2. Gas in the descending colon and rectum. Electronically Signed   By: Genevive BiStewart  Edmunds M.D.   On: 11/12/2015 09:56    Assessment/Plan: I spent a great time talking  with the patient and his wife. They would like to consider transfer a repeat CT scan as they're concerned about possibility of Crohn's disease diverticulitis or colon cancer. I told them that I think his clinical examination is improved and with his low potassium he certainly has reason for postoperative paralytic ileus. We will adjust his psych meds and asked psychiatry to see him at their request to assist with his medications. If we do not see any improvement with his ileus. His CT scan and discuss transfer as they requested.

## 2015-11-13 NOTE — Progress Notes (Signed)
Pt and pt.'s wife expressed to RN that "they are concerned about pt.'s condition and has major  concerns that they wanted to express to the on-call surgeon and wanted answers". Director of 2C, Calton DachSherea was notified of pt.'s concerns and rounded on pt. On-call surgeon was notified of pt.'s concerns and stated he will round on pt soon.   Nathaniel Hess

## 2015-11-14 ENCOUNTER — Inpatient Hospital Stay: Payer: BLUE CROSS/BLUE SHIELD

## 2015-11-14 LAB — BASIC METABOLIC PANEL
Anion gap: 8 (ref 5–15)
BUN: 16 mg/dL (ref 6–20)
CHLORIDE: 101 mmol/L (ref 101–111)
CO2: 30 mmol/L (ref 22–32)
CREATININE: 1.27 mg/dL — AB (ref 0.61–1.24)
Calcium: 8.9 mg/dL (ref 8.9–10.3)
GFR calc non Af Amer: >60 mL/min (ref >60)
Glucose, Bld: 146 mg/dL — ABNORMAL HIGH (ref 65–99)
Potassium: 4.3 mmol/L (ref 3.5–5.1)
Sodium: 139 mmol/L (ref 135–145)

## 2015-11-14 MED ORDER — LORAZEPAM 2 MG/ML IJ SOLN
1.0000 mg | INTRAMUSCULAR | Status: DC | PRN
  Administered 2015-11-15 – 2015-11-16 (×5): 2 mg via INTRAVENOUS
  Administered 2015-11-16 – 2015-11-17 (×2): 1 mg via INTRAVENOUS
  Administered 2015-11-17: 2 mg via INTRAVENOUS
  Filled 2015-11-14 (×8): qty 1

## 2015-11-14 MED ORDER — MIRTAZAPINE 15 MG PO TBDP
30.0000 mg | ORAL_TABLET | Freq: Every day | ORAL | Status: DC
  Administered 2015-11-14 – 2015-11-15 (×2): 30 mg via ORAL
  Filled 2015-11-14 (×2): qty 2

## 2015-11-14 MED ORDER — LORAZEPAM 2 MG/ML IJ SOLN
1.0000 mg | INTRAMUSCULAR | Status: DC | PRN
  Administered 2015-11-14: 1 mg via INTRAVENOUS
  Filled 2015-11-14: qty 1

## 2015-11-14 MED ORDER — KCL IN DEXTROSE-NACL 20-5-0.45 MEQ/L-%-% IV SOLN
INTRAVENOUS | Status: DC
  Administered 2015-11-14: 1000 mL via INTRAVENOUS
  Administered 2015-11-15 – 2015-11-16 (×2): via INTRAVENOUS
  Filled 2015-11-14 (×5): qty 1000

## 2015-11-14 NOTE — Progress Notes (Signed)
I was called back to the patient's room to talk with his wife. She is very upset about his mental status. He's been sleeping most of the day. I suspect that problem was related to his recent Ativan dosages. Yesterday he was very concerned about anxiety and depression and was requesting more medication. We reviewed the psychiatry consult did not see any intravenous medicines we might use in this situation.  Clinically I think he is improving. I reassured the wife that his current mental status with his lethargy somnolence is not related to his GI problems. They are strongly considering transfer. We will accommodate them in trying to arrange transfer to another facility if they so required.  I did discuss the case with the psychiatrist.

## 2015-11-14 NOTE — Care Management Note (Signed)
Case Management Note  Patient Details  Name: Nathaniel Hess MRN: [id number] Date of Birth: [birth year]  Subjective/Objective:   Discussed FLMA paperwork with Mr Hiers's wife, Informed her that the Case Managers and Social Workers do not complete FLMA forms. Mr Quinones's physician's office will need to complete the FLMA. FLMA paperwork was placed in a [employer] folder and given to Mr Mackey Birchwoodaulette.                   Action/Plan:   Expected Discharge Date:                  Expected Discharge Plan:     In-House Referral:     Discharge planning Services     Post Acute Care Choice:    Choice offered to:     DME Arranged:    DME Agency:     HH Arranged:    HH Agency:     Status of Service:     If discussed at [employer] Length of Stay Meetings, dates discussed:    Additional Comments:  Ivyanna Sibert A, RN 11/14/2015, 3:31 PM

## 2015-11-14 NOTE — Progress Notes (Signed)
Subjective:   He is much calmer today with less with obviously less anxiety. He is not nearly subset or anxious during interview today. He's feeling better with less abdominal pain. He is not passing any gas yet but does feel that he is noted some rumbling in his abdomen. His plain films are essentially the same with a staple ileus. There is no sign of progressive bowel obstruction. His laboratory values are improving with his potassium coming up.  Vital signs in last 24 hours: Temp:  [97.6 F (36.4 C)-98.5 F (36.9 C)] 98.5 F (36.9 C) (09/19 1402) Pulse Rate:  [70-90] 90 (09/19 1402) Resp:  [18] 18 (09/19 1402) BP: (101-132)/(58-85) 101/58 (09/19 1402) SpO2:  [95 %-99 %] 95 % (09/19 1402) Last BM Date: 11/10/15  Intake/Output from previous day: 09/18 0701 - 09/19 0700 In: 2907 [I.V.:2757; IV Piggyback:150] Out: 4450 [Urine:600; Emesis/NG output:3850]  Exam:  His abdomen is soft with no abdominal tenderness and good bowel sounds. I don't see any wound problems. He is much less anxious. He has normal breath sounds with no adventitious sounds or wheezing.  Lab Results:  CBC  Recent Labs  11/13/15 0534  WBC 7.6  HGB 14.6  HCT 41.3  PLT 320   CMP     Component Value Date/Time   NA 139 11/14/2015 0512   NA 144 01/15/2014 1848   K 4.3 11/14/2015 0512   K 3.7 01/15/2014 1848   CL 101 11/14/2015 0512   CL 108 (H) 01/15/2014 1848   CO2 30 11/14/2015 0512   CO2 23 01/15/2014 1848   GLUCOSE 146 (H) 11/14/2015 0512   GLUCOSE 111 (H) 01/15/2014 1848   BUN 16 11/14/2015 0512   BUN 10 01/15/2014 1848   CREATININE 1.27 (H) 11/14/2015 0512   CREATININE 1.30 01/15/2014 1848   CALCIUM 8.9 11/14/2015 0512   CALCIUM 8.5 01/15/2014 1848   PROT 8.5 (H) 11/05/2015 2339   PROT 7.1 01/15/2014 1848   ALBUMIN 5.1 (H) 11/05/2015 2339   ALBUMIN 3.4 01/15/2014 1848   AST 47 (H) 11/05/2015 2339   AST 33 01/15/2014 1848   ALT 38 11/05/2015 2339   ALT 41 01/15/2014 1848   ALKPHOS 89  11/05/2015 2339   ALKPHOS 89 01/15/2014 1848   BILITOT 1.2 11/05/2015 2339   BILITOT 0.4 01/15/2014 1848   GFRNONAA >60 11/14/2015 0512   GFRNONAA >60 01/15/2014 1848   GFRAA >60 11/14/2015 0512   GFRAA >60 01/15/2014 1848   PT/INR No results for input(s): LABPROT, INR in the last 72 hours.  Studies/Results: Dg Abd 2 Views  Result Date: 11/14/2015 CLINICAL DATA:  Evaluate.  Healed EXAM: ABDOMEN - 2 VIEW COMPARISON:  None. FINDINGS: The nasogastric tube tip is in the distal stomach. Again noted are skin staples along the ventral aspect of the lower abdomen. Gaseous distension of the bowel loops persist but may be minimally improved. No new findings. IMPRESSION: Stable to improved appearance of ileus. Electronically Signed   By: Signa Kell M.D.   On: 11/14/2015 08:22   Dg Abd 2 Views  Result Date: 11/13/2015 CLINICAL DATA:  Follow-up small bowel obstruction EXAM: ABDOMEN - 2 VIEW COMPARISON:  In the 11/11/2005, 11/10/2005, and CT abdomen pelvis of 11/06/2015 FINDINGS: There are persistently dilated loops of small bowel with air-fluid levels consistent with persistent partial small bowel obstruction. NGT remains with the tip in the distal body of the stomach. Surgical staples are noted across the midline of the lower abdomen and upper pelvis. No free  air is seen on the erect view. IMPRESSION: Persistent partial small bowel obstruction. NG tube tip in the distal body of stomach Electronically Signed   By: Dwyane DeePaul  Barry M.D.   On: 11/13/2015 08:38    Assessment/Plan: I've independently reviewed his films. He seems to be improving clinically. We certainly appreciate the psychiatry consultation which was well received an most helpful. We'll continue his current medication regimen hopefully get his nasogastric tube out in the next 24 hours. I discussed TPN with him but I do believe he'll be able to eat shortly. We may try clamping his nasogastric tube at some point in order to see how he does with  the increased gastric contents. I do think were making progress.

## 2015-11-14 NOTE — Progress Notes (Signed)
Initial Nutrition Assessment  DOCUMENTATION CODES:   Not applicable  INTERVENTION:  -Discussed nutrition poc with MD Michela PitcherEly; MD is aware of NPO/CL status. Discussed possibility of nutrition support if unable to advance diet. Will continue to assess. -Recommend new weight as no wt since 9/13  NUTRITION DIAGNOSIS:   Inadequate oral intake related to acute illness as evidenced by NPO status.  GOAL:   Patient will meet greater than or equal to 90% of their needs  MONITOR:   Diet advancement, Labs, Weight trends  REASON FOR ASSESSMENT:   NPO/Clear Liquid Diet    ASSESSMENT:   45 yo male admitted with SBO s/p ex lap and lysis of adhesions on 9/13. Pt now with possible paralytic ileus, high NG output. Pt also with generalized anxiety disorder being follow by psychiatry.    NPO/CL day 8.  NG output 3.85 L in past 24 hours, 400 mL documented thus far today  Past Medical History:  Diagnosis Date  . Barrett's esophagus     Diet Order:  Diet NPO time specified  Skin:  Reviewed, no issues  Last BM:  9/15   Labs: reviewed  Meds: D5-1/2 NS with KCl at 150 ml/hr  Height:   Ht Readings from Last 1 Encounters:  11/08/15 5\' 10"  (1.778 m)    Weight:   Wt Readings from Last 1 Encounters:  11/08/15 203 lb (92.1 kg)   Filed Weights   11/05/15 2343 11/06/15 0629 11/08/15 1008  Weight: 240 lb (108.9 kg) 203 lb (92.1 kg) 203 lb (92.1 kg)    BMI:  Body mass index is 29.13 kg/m.  Estimated Nutritional Needs:   Kcal:  2100-2500 kcals  Protein:  105-125 g  Fluid:  >/= 2 L  EDUCATION NEEDS:   No education needs identified at this time  Romelle StarcherCate Jerrilyn Messinger MS, RD, LDN [phone] Pager  [phone] Weekend/On-Call Pager

## 2015-11-14 NOTE — Consult Note (Signed)
[facility] Consult   Reason for Consult:  Consult for 45 year old man with a history of depression and anxiety currently in the hospital for abdominal surgery Referring Physician:  Pat Patrick Patient Identification: Nathaniel Hess MRN:  [id number] Principal Diagnosis: Generalized anxiety disorder Diagnosis:   Patient Active Problem List   Diagnosis Date Noted  . Generalized anxiety disorder [F41.1] 11/13/2015  . Serotonin withdrawal syndrome ([facility]) [T50.995A] 11/13/2015  . SBO (small bowel obstruction) ([facility]) [K56.69] 11/06/2015    Total Time spent with patient: 20 minutes  Subjective:   Nathaniel Hess is a 45 y.o. male patient admitted with "I'm pretty frustrated".   Follow-up note for Tuesday the 19th. Spoke with the patient and his wife again. Spoke briefly with Dr.Ely and the current RN. Medically his condition seems to be slightly improved compared to yesterday. No worse. He still requires the suction tube and is nothing by mouth. Mentally patient actually indicated to me he is feeling worse. Wife told me that he was making some statements earlier today about how he might as well be dead. Patient denies that he has any intention to act on killing himself but says he is feeling more hopeless. Affect blunted and dysphoric. Doesn't appear to be psychotic. Hasn't been acting out or doing anything dangerous. Apparently the question of whether the patient will request being transferred to another facility has been raised. During the time I was speaking with him the patient said that he had decided that he wanted to pursue that.  HPI:  Patient interviewed. Got to speak with his wife as well. Chart reviewed. This is a 41 year old man who is currently in the hospital for abdominal surgery. Had a small bowel obstruction that was relieved surgically. He has continued to have symptoms typical of an ileus for several days afterwards. He has failed withdrawal of nasogastric suction. Patient is  unable to take any oral medication as a result and has not had his antidepressant medicine or Xanax. He is growing increasingly frustrated and irritated at his medical care. Patient took advantage of our interview to discuss a wide range of frustrations in his life including long-standing financial worries, frustrations with the way that his wife keeps house, lingering resentment towards his parents, lingering shame and issues regarding his parent's illnesses as well as his anxiety around his own current physical state. Prior to hospitalization he was taking Xanax at the low dose of 0.25 mg once a day, Cymbalta 60 mg a day and buspirone either 5 or 15 mg once a day. Since being in the hospital he has received intravenous Ativan and is now receiving doses as high as 2 mg every 6 hours and yet remains very anxious. Today he spoke with Dr. Pat Patrick at some length and yet still seems to be perplexed by his condition. He discussed with meal wide range of gastrointestinal diseases that he wanted to have ruled out. He is not sleeping well. He feels depressed. He has had some hopelessness and passive suicidality although without any intent or plan. He is not having any psychotic symptoms or hallucinations. He feels that the Ativan is not providing significant relief.  Social history: Patient is married has one daughter who lives at home. He works as an Clinical biochemist. Obviously he is earning no money while he is in the hospital. Wife is on disability. There were clearly a lot of financial worries and resentments that he openly discussed in front of his wife.  Medical history: He is currently in  the hospital for relief of the small bowel obstruction. Having postoperative ileus.  Substance abuse history: Denies alcohol or drug abuse  Past Psychiatric History: Was seeing Dr. Lovie Macadamia for outpatient psychiatric treatment. No history of suicide attempts. No history of hospitalization. Sounds like he had had at least partial  response to Cymbalta and BuSpar and low doses of Xanax.  Risk to Self: Is patient at risk for suicide?: No Risk to Others:   Prior Inpatient Therapy:   Prior Outpatient Therapy:    Past Medical History:  Past Medical History:  Diagnosis Date  . Barrett's esophagus     Past Surgical History:  Procedure Laterality Date  . BOWEL RESECTION N/A 11/08/2015   Procedure: SMALL BOWEL RESECTION Possible;  Surgeon: Florene Glen, MD;  Location: [facility] ORS;  Service: General;  Laterality: N/A;  . ESOPHAGUS SURGERY    . LAPAROTOMY N/A 11/08/2015   Procedure: EXPLORATORY LAPAROTOMY;  Surgeon: Florene Glen, MD;  Location: [facility] ORS;  Service: General;  Laterality: N/A;  . NISSEN FUNDOPLICATION     Family History: History reviewed. No pertinent family history. Family Psychiatric  History: Sounds like his father had an alcohol problem Social History:  History  Alcohol Use  . 1.8 oz/week  . 3 Cans of beer per week     History  Drug Use No    Social History   Social History  . Marital status: Married    Spouse name: N/A  . Number of children: N/A  . Years of education: N/A   Social History Main Topics  . Smoking status: Never Smoker  . Smokeless tobacco: Never Used  . Alcohol use 1.8 oz/week    3 Cans of beer per week  . Drug use: No  . Sexual activity: Not Asked   Other Topics Concern  . None   Social History Narrative  . None   Additional Social History:    Allergies:  No Known Allergies  Labs:  Results for orders placed or performed during the hospital encounter of 11/05/15 (from the past 48 hour(s))  CBC with Differential/Platelet     Status: Abnormal   Collection Time: 11/13/15  5:34 AM  Result Value Ref Range   WBC 7.6 3.8 - 10.6 K/uL   RBC 4.80 4.40 - 5.90 MIL/uL   Hemoglobin 14.6 13.0 - 18.0 g/dL   HCT 41.3 40.0 - 52.0 %   MCV 86.0 80.0 - 100.0 fL   MCH 30.4 26.0 - 34.0 pg   MCHC 35.4 32.0 - 36.0 g/dL   RDW 13.3 11.5 - 14.5 %   Platelets 320 150 - 440  K/uL   Neutrophils Relative % 47 %   Neutro Abs 3.5 1.4 - 6.5 K/uL   Lymphocytes Relative 28 %   Lymphs Abs 2.2 1.0 - 3.6 K/uL   Monocytes Relative 12 %   Monocytes Absolute 0.9 0.2 - 1.0 K/uL   Eosinophils Relative 12 %   Eosinophils Absolute 0.9 (H) 0 - 0.7 K/uL   Basophils Relative 1 %   Basophils Absolute 0.1 0 - 0.1 K/uL  Basic metabolic panel     Status: Abnormal   Collection Time: 11/13/15  5:34 AM  Result Value Ref Range   Sodium 140 135 - 145 mmol/L   Potassium 3.0 (L) 3.5 - 5.1 mmol/L   Chloride 96 (L) 101 - 111 mmol/L   CO2 34 (H) 22 - 32 mmol/L   Glucose, Bld 108 (H) 65 - 99 mg/dL   BUN 18  6 - 20 mg/dL   Creatinine, Ser 1.05 0.61 - 1.24 mg/dL   Calcium 8.5 (L) 8.9 - 10.3 mg/dL   GFR calc non Af Amer >60 >60 mL/min   GFR calc Af Amer >60 >60 mL/min    Comment: (NOTE) The eGFR has been calculated using the CKD EPI equation. This calculation has not been validated in all clinical situations. eGFR's persistently <60 mL/min signify possible Chronic Kidney Disease.    Anion gap 10 5 - 15  Basic metabolic panel     Status: Abnormal   Collection Time: 11/14/15  5:12 AM  Result Value Ref Range   Sodium 139 135 - 145 mmol/L   Potassium 4.3 3.5 - 5.1 mmol/L   Chloride 101 101 - 111 mmol/L   CO2 30 22 - 32 mmol/L   Glucose, Bld 146 (H) 65 - 99 mg/dL   BUN 16 6 - 20 mg/dL   Creatinine, Ser 1.27 (H) 0.61 - 1.24 mg/dL   Calcium 8.9 8.9 - 10.3 mg/dL   GFR calc non Af Amer >60 >60 mL/min   GFR calc Af Amer >60 >60 mL/min    Comment: (NOTE) The eGFR has been calculated using the CKD EPI equation. This calculation has not been validated in all clinical situations. eGFR's persistently <60 mL/min signify possible Chronic Kidney Disease.    Anion gap 8 5 - 15    Current Facility-Administered Medications  Medication Dose Route Frequency Provider Last Rate Last Dose  . dextrose 5 % and 0.45 % NaCl with KCl 20 mEq/L infusion   Intravenous Continuous Dia Crawford III, MD      .  enoxaparin (LOVENOX) injection 40 mg  40 mg Subcutaneous Q24H Dia Crawford III, MD   40 mg at 11/14/15 [phone]  . famotidine (PEPCID) IVPB 20 mg premix  20 mg Intravenous Q12H Dia Crawford III, MD   20 mg at 11/14/15 0956  . HYDROmorphone (DILAUDID) injection 1 mg  1 mg Intravenous Q3H PRN Hubbard Robinson, MD   1 mg at 11/13/15 2046  . LORazepam (ATIVAN) injection 1 mg  1 mg Intravenous Q4H PRN Dia Crawford III, MD      . menthol-cetylpyridinium (CEPACOL) lozenge 3 mg  1 lozenge Oral PRN Dia Crawford III, MD   3 mg at 11/06/15 1548  . mirtazapine (REMERON SOL-TAB) disintegrating tablet 30 mg  30 mg Oral QHS Gonzella Lex, MD      . ondansetron [facility]) injection 4 mg  4 mg Intravenous Q4H PRN Jules Husbands, MD   4 mg at 11/11/15 2030   Or  . ondansetron (ZOFRAN-ODT) disintegrating tablet 4 mg  4 mg Oral Q4H PRN Jules Husbands, MD        Musculoskeletal: Strength & Muscle Tone: within normal limits Gait & Station: unsteady Patient leans: N/A  Psychiatric Specialty Exam: Physical Exam  Nursing note and vitals reviewed. Constitutional: He appears well-developed and well-nourished.  HENT:  Head: Normocephalic and atraumatic.  Eyes: Conjunctivae are normal. Pupils are equal, round, and reactive to light.  Neck: Normal range of motion.  Cardiovascular: Regular rhythm and normal heart sounds.   Respiratory: Effort normal.  GI:    Musculoskeletal: Normal range of motion.  Neurological: He is alert.  Skin: Skin is warm and dry.  Psychiatric: His speech is normal. His mood appears anxious. He is agitated. Thought content is not paranoid. Cognition and memory are normal. He expresses impulsivity. He expresses no homicidal and no suicidal ideation.  Review of Systems  Constitutional: Positive for malaise/fatigue.  HENT: Negative.   Eyes: Negative.   Respiratory: Negative.   Cardiovascular: Negative.   Gastrointestinal: Positive for abdominal pain and nausea.  Musculoskeletal: Negative.   Skin:  Negative.   Neurological: Negative.   Psychiatric/Behavioral: Positive for depression and suicidal ideas. Negative for hallucinations, memory loss and substance abuse. The patient is nervous/anxious and has insomnia.     Blood pressure (!) 101/58, pulse 90, temperature 98.5 F (36.9 C), temperature source Oral, resp. rate 18, height 5' 10"  (1.778 m), weight 92.1 kg (203 lb), SpO2 95 %.Body mass index is 29.13 kg/m.  General Appearance: Casual  Eye Contact:  Good  Speech:  Clear and Coherent  Volume:  Increased  Mood:  Angry and Irritable  Affect:  Congruent  Thought Process:  Goal Directed  Orientation:  Full (Time, Place, and Person)  Thought Content:  Logical and Rumination  Suicidal Thoughts:  Yes.  without intent/plan  Homicidal Thoughts:  No  Memory:  Immediate;   Good Recent;   Good Remote;   Fair  Judgement:  Fair  Insight:  Fair  Psychomotor Activity:  Decreased  Concentration:  Concentration: Fair  Recall:  [employer] of Knowledge:  Fair  Language:  Fair  Akathisia:  No  Handed:  Right  AIMS (if indicated):     Assets:  Communication Skills Desire for Improvement Housing Resilience Social Support  ADL's:  Impaired  Cognition:  WNL  Sleep:        Treatment Plan Summary: Daily contact with patient to assess and evaluate symptoms and progress in treatment, Medication management and Plan Mood still seems to be pretty down. I don't think he is at high risk to act out to harm himself in the hospital but I do think he needs continual psychiatric follow-up and I will certainly continue to follow-up with him while he is here. If he does end up getting transferred I would recommend that psychiatric consult follow him after he leaves here. He is going to need a reevaluation as to whether he is dangerous or needs any different psychiatric treatment before being discharged. I explained to the patient and his wife how we do not have any intravenous antidepressant. I explained my  decision to provide oral disintegrating Remeron. I am going to increase the dose of that to 30 mg tonight because that is a more effective antidepressant dose. I talked with Dr. Pat Patrick and I agree with his plan to decrease the amount of Ativan the patient is getting. I don't think it's helping him feel any better and it's just making him overly drowsy and probably impairing him cognitively. Tried to give some support and education to the patient. I will will continue to follow-up while he is here.  Disposition: Patient does not meet criteria for psychiatric inpatient admission. Supportive therapy provided about ongoing stressors.  Alethia Berthold, MD 11/14/2015 5:18 PM

## 2015-11-15 LAB — BASIC METABOLIC PANEL
ANION GAP: 7 (ref 5–15)
BUN: 13 mg/dL (ref 6–20)
CALCIUM: 9 mg/dL (ref 8.9–10.3)
CHLORIDE: 104 mmol/L (ref 101–111)
CO2: 30 mmol/L (ref 22–32)
Creatinine, Ser: 1.27 mg/dL — ABNORMAL HIGH (ref 0.61–1.24)
GFR calc non Af Amer: >60 mL/min (ref >60)
GLUCOSE: 110 mg/dL — AB (ref 65–99)
Potassium: 3.9 mmol/L (ref 3.5–5.1)
Sodium: 141 mmol/L (ref 135–145)

## 2015-11-15 LAB — CBC
HEMATOCRIT: 43.2 % (ref 40.0–52.0)
HEMOGLOBIN: 15.1 g/dL (ref 13.0–18.0)
MCH: 30.1 pg (ref 26.0–34.0)
MCHC: 34.9 g/dL (ref 32.0–36.0)
MCV: 86.3 fL (ref 80.0–100.0)
Platelets: 363 10*3/uL (ref 150–440)
RBC: 5.01 MIL/uL (ref 4.40–5.90)
RDW: 13.3 % (ref 11.5–14.5)
WBC: 8.7 10*3/uL (ref 3.8–10.6)

## 2015-11-15 MED ORDER — BISACODYL 10 MG RE SUPP
10.0000 mg | Freq: Once | RECTAL | Status: AC
  Administered 2015-11-15: 10 mg via RECTAL
  Filled 2015-11-15: qty 1

## 2015-11-15 NOTE — Progress Notes (Signed)
Called to room by patient's wife stating that IV leaking. IV found to be unhooked via patient and leaking into bed. Patient wife also stated that patient is taking about wanting to die and "cash in all his checks" and that he is refusing to get out of bed and walk, even to the bathroom. Wife then proceeds to show me messages from last night from the patient stating that he wanted to die and give up. Dr. Toni Amendlapacs notified of patient's actions and suicide sitter order placed. He stated that he would see patient later on.

## 2015-11-15 NOTE — Consult Note (Signed)
[facility] Consult   Reason for Consult:  Consult for 45 year old man with a history of depression and anxiety currently in the hospital for abdominal surgery Referring Physician:  Pat Patrick Patient Identification: LAMICHAEL YOUKHANA MRN:  [id number] Principal Diagnosis: Generalized anxiety disorder Diagnosis:   Patient Active Problem List   Diagnosis Date Noted  . Generalized anxiety disorder [F41.1] 11/13/2015  . Serotonin withdrawal syndrome ([place]) [T50.995A] 11/13/2015  . SBO (small bowel obstruction) ([facility]) [K56.69] 11/06/2015    Total Time spent with patient: 20 minutes  Subjective:   HARRISON ZETINA is a 45 y.o. male patient admitted with "I'm pretty frustrated".   Follow-up note for Wednesday, September 20. Patient seen. Talked with nursing staff a couple times today and with Dr. Pat Patrick briefly. Patient has continued to appear to be depressed and somewhat irritable today. Nurses found the patient at one point today with his IV out and leaking and circumstantial evidence suggested the patient had probably pulled it out. He had made several suicidal statements reportedly to his wife. On interview this evening the patient was awake but only a little bit interactive. Very psychomotor retarded. Only brief eye contact. Speech very quiet and decreased. He admits that he is feeling very depressed. He denies any suicidal ideation but again he wasn't talking very much. From a physical standpoint it sounds like his ileus is improving and there is a good chance that he may be able to take by mouth by tomorrow .  HPI:  Patient interviewed. Got to speak with his wife as well. Chart reviewed. This is a 55 year old man who is currently in the hospital for abdominal surgery. Had a small bowel obstruction that was relieved surgically. He has continued to have symptoms typical of an ileus for several days afterwards. He has failed withdrawal of nasogastric suction. Patient is unable to take any oral  medication as a result and has not had his antidepressant medicine or Xanax. He is growing increasingly frustrated and irritated at his medical care. Patient took advantage of our interview to discuss a wide range of frustrations in his life including long-standing financial worries, frustrations with the way that his wife keeps house, lingering resentment towards his parents, lingering shame and issues regarding his parent's illnesses as well as his anxiety around his own current physical state. Prior to hospitalization he was taking Xanax at the low dose of 0.25 mg once a day, Cymbalta 60 mg a day and buspirone either 5 or 15 mg once a day. Since being in the hospital he has received intravenous Ativan and is now receiving doses as high as 2 mg every 6 hours and yet remains very anxious. Today he spoke with Dr. Pat Patrick at some length and yet still seems to be perplexed by his condition. He discussed with meal wide range of gastrointestinal diseases that he wanted to have ruled out. He is not sleeping well. He feels depressed. He has had some hopelessness and passive suicidality although without any intent or plan. He is not having any psychotic symptoms or hallucinations. He feels that the Ativan is not providing significant relief.  Social history: Patient is married has one daughter who lives at home. He works as an Clinical biochemist. Obviously he is earning no money while he is in the hospital. Wife is on disability. There were clearly a lot of financial worries and resentments that he openly discussed in front of his wife.  Medical history: He is currently in the hospital for relief of the  small bowel obstruction. Having postoperative ileus.  Substance abuse history: Denies alcohol or drug abuse  Past Psychiatric History: Was seeing Dr. Lovie Macadamia for outpatient psychiatric treatment. No history of suicide attempts. No history of hospitalization. Sounds like he had had at least partial response to Cymbalta and  BuSpar and low doses of Xanax.  Risk to Self: Is patient at risk for suicide?: No Risk to Others:   Prior Inpatient Therapy:   Prior Outpatient Therapy:    Past Medical History:  Past Medical History:  Diagnosis Date  . Barrett's esophagus     Past Surgical History:  Procedure Laterality Date  . BOWEL RESECTION N/A 11/08/2015   Procedure: SMALL BOWEL RESECTION Possible;  Surgeon: Florene Glen, MD;  Location: [facility] ORS;  Service: General;  Laterality: N/A;  . ESOPHAGUS SURGERY    . LAPAROTOMY N/A 11/08/2015   Procedure: EXPLORATORY LAPAROTOMY;  Surgeon: Florene Glen, MD;  Location: [facility] ORS;  Service: General;  Laterality: N/A;  . NISSEN FUNDOPLICATION     Family History: History reviewed. No pertinent family history. Family Psychiatric  History: Sounds like his father had an alcohol problem Social History:  History  Alcohol Use  . 1.8 oz/week  . 3 Cans of beer per week     History  Drug Use No    Social History   Social History  . Marital status: Married    Spouse name: N/A  . Number of children: N/A  . Years of education: N/A   Social History Main Topics  . Smoking status: Never Smoker  . Smokeless tobacco: Never Used  . Alcohol use 1.8 oz/week    3 Cans of beer per week  . Drug use: No  . Sexual activity: Not Asked   Other Topics Concern  . None   Social History Narrative  . None   Additional Social History:    Allergies:  No Known Allergies  Labs:  Results for orders placed or performed during the hospital encounter of 11/05/15 (from the past 48 hour(s))  Basic metabolic panel     Status: Abnormal   Collection Time: 11/14/15  5:12 AM  Result Value Ref Range   Sodium 139 135 - 145 mmol/L   Potassium 4.3 3.5 - 5.1 mmol/L   Chloride 101 101 - 111 mmol/L   CO2 30 22 - 32 mmol/L   Glucose, Bld 146 (H) 65 - 99 mg/dL   BUN 16 6 - 20 mg/dL   Creatinine, Ser 1.27 (H) 0.61 - 1.24 mg/dL   Calcium 8.9 8.9 - 10.3 mg/dL   GFR calc non Af Amer >60 >60  mL/min   GFR calc Af Amer >60 >60 mL/min    Comment: (NOTE) The eGFR has been calculated using the CKD EPI equation. This calculation has not been validated in all clinical situations. eGFR's persistently <60 mL/min signify possible Chronic Kidney Disease.    Anion gap 8 5 - 15  CBC     Status: None   Collection Time: 11/15/15  4:33 AM  Result Value Ref Range   WBC 8.7 3.8 - 10.6 K/uL   RBC 5.01 4.40 - 5.90 MIL/uL   Hemoglobin 15.1 13.0 - 18.0 g/dL   HCT 43.2 40.0 - 52.0 %   MCV 86.3 80.0 - 100.0 fL   MCH 30.1 26.0 - 34.0 pg   MCHC 34.9 32.0 - 36.0 g/dL   RDW 13.3 11.5 - 14.5 %   Platelets 363 150 - 440 K/uL  Basic metabolic panel  Status: Abnormal   Collection Time: 11/15/15  4:33 AM  Result Value Ref Range   Sodium 141 135 - 145 mmol/L   Potassium 3.9 3.5 - 5.1 mmol/L   Chloride 104 101 - 111 mmol/L   CO2 30 22 - 32 mmol/L   Glucose, Bld 110 (H) 65 - 99 mg/dL   BUN 13 6 - 20 mg/dL   Creatinine, Ser 1.27 (H) 0.61 - 1.24 mg/dL   Calcium 9.0 8.9 - 10.3 mg/dL   GFR calc non Af Amer >60 >60 mL/min   GFR calc Af Amer >60 >60 mL/min    Comment: (NOTE) The eGFR has been calculated using the CKD EPI equation. This calculation has not been validated in all clinical situations. eGFR's persistently <60 mL/min signify possible Chronic Kidney Disease.    Anion gap 7 5 - 15    Current Facility-Administered Medications  Medication Dose Route Frequency Provider Last Rate Last Dose  . dextrose 5 % and 0.45 % NaCl with KCl 20 mEq/L infusion   Intravenous Continuous Dia Crawford III, MD 75 mL/hr at 11/15/15 0910    . enoxaparin (LOVENOX) injection 40 mg  40 mg Subcutaneous Q24H Dia Crawford III, MD   40 mg at 11/15/15 1027  . famotidine (PEPCID) IVPB 20 mg premix  20 mg Intravenous Q12H Dia Crawford III, MD   20 mg at 11/15/15 0910  . HYDROmorphone (DILAUDID) injection 1 mg  1 mg Intravenous Q3H PRN Hubbard Robinson, MD   1 mg at 11/15/15 1734  . LORazepam (ATIVAN) injection 1-2 mg  1-2 mg  Intravenous Q4H PRN Clayburn Pert, MD   2 mg at 11/15/15 1758  . menthol-cetylpyridinium (CEPACOL) lozenge 3 mg  1 lozenge Oral PRN Dia Crawford III, MD   3 mg at 11/06/15 1548  . mirtazapine (REMERON SOL-TAB) disintegrating tablet 30 mg  30 mg Oral QHS Gonzella Lex, MD   30 mg at 11/14/15 2122  . ondansetron (ZOFRAN) injection 4 mg  4 mg Intravenous Q4H PRN Jules Husbands, MD   4 mg at 11/11/15 2030   Or  . ondansetron (ZOFRAN-ODT) disintegrating tablet 4 mg  4 mg Oral Q4H PRN Jules Husbands, MD        Musculoskeletal: Strength & Muscle Tone: within normal limits Gait & Station: unsteady Patient leans: N/A  Psychiatric Specialty Exam: Physical Exam  Nursing note and vitals reviewed. Constitutional: He appears well-developed and well-nourished.  HENT:  Head: Normocephalic and atraumatic.  Eyes: Conjunctivae are normal. Pupils are equal, round, and reactive to light.  Neck: Normal range of motion.  Cardiovascular: Regular rhythm and normal heart sounds.   Respiratory: Effort normal.  GI:    Musculoskeletal: Normal range of motion.  Neurological: He is alert.  Skin: Skin is warm and dry.  Psychiatric: His speech is normal. His mood appears anxious. He is agitated. Thought content is not paranoid. Cognition and memory are normal. He expresses impulsivity. He exhibits a depressed mood. He expresses suicidal ideation. He expresses no homicidal ideation.    Review of Systems  Constitutional: Positive for malaise/fatigue.  HENT: Negative.   Eyes: Negative.   Respiratory: Negative.   Cardiovascular: Negative.   Gastrointestinal: Positive for abdominal pain and nausea.  Musculoskeletal: Negative.   Skin: Negative.   Neurological: Negative.   Psychiatric/Behavioral: Positive for depression and suicidal ideas. Negative for hallucinations, memory loss and substance abuse. The patient is nervous/anxious and has insomnia.     Blood pressure 105/72, pulse 77, temperature 97.9  F (36.6 C),  temperature source Oral, resp. rate 19, height 5' 10"  (1.778 m), weight 92.1 kg (203 lb), SpO2 97 %.Body mass index is 29.13 kg/m.  General Appearance: Casual  Eye Contact:  Good  Speech:  Clear and Coherent  Volume:  Increased  Mood:  Angry and Irritable  Affect:  Congruent  Thought Process:  Goal Directed  Orientation:  Full (Time, Place, and Person)  Thought Content:  Logical and Rumination  Suicidal Thoughts:  Yes.  without intent/plan  Homicidal Thoughts:  No  Memory:  Immediate;   Good Recent;   Good Remote;   Fair  Judgement:  Fair  Insight:  Fair  Psychomotor Activity:  Decreased  Concentration:  Concentration: Fair  Recall:  [employer] of Knowledge:  Fair  Language:  Fair  Akathisia:  No  Handed:  Right  AIMS (if indicated):     Assets:  Communication Skills Desire for Improvement Housing Resilience Social Support  ADL's:  Impaired  Cognition:  WNL  Sleep:        Treatment Plan Summary: Daily contact with patient to assess and evaluate symptoms and progress in treatment, Medication management and Plan Patient seen for follow-up today. One would hope that the good news that he does not need the suction and should be able to possibly remove the tube by tomorrow would lift someone spirits but this person still seems to be quite down in the dumps and depressed. Although he denied suicidal ideation when I was speaking with him I'm pretty concerned about his mood and behavior. I ordered a suicide precaution 1-1 sitter earlier today and I would like to continue that to minimize the risk that he is going to act out. I will continue to reevaluate regularly. As soon as he can take oral pills again we can restart his medication. I wanted to try to be sure that he is safe enough by the time he is physically ready for discharge that we are able to feel comfortable with his psychiatric stability.  Disposition: Patient does not meet criteria for psychiatric inpatient  admission. Supportive therapy provided about ongoing stressors.  Alethia Berthold, MD 11/15/2015 6:24 PM

## 2015-11-15 NOTE — Care Management (Signed)
RNCM consult placed for potential rehab placement.  CSW notified.  At this time patient does not have a skillable need for rehab.  Psych has been consulted, and patient has been placed on suicide precautions.

## 2015-11-15 NOTE — Progress Notes (Signed)
Subjective:   He is now under suicide watch because of his actions recently. Overall he feels better with less abdominal pain and he has been off suction for approximately 8 hours with no nausea or vomiting. He has not moved his bowels as yet. He does have some flatus.  Vital signs in last 24 hours: Temp:  [97.9 F (36.6 C)-98.6 F (37 C)] 97.9 F (36.6 C) (09/20 1100) Pulse Rate:  [73-103] 77 (09/20 1100) Resp:  [17-21] 19 (09/20 1100) BP: (102-105)/(66-72) 105/72 (09/20 1100) SpO2:  [94 %-97 %] 97 % (09/20 1100) Last BM Date: 11/10/15  Intake/Output from previous day: 09/19 0701 - 09/20 0700 In: 2005.8 [I.V.:1905.8; IV Piggyback:100] Out: 3325 [Urine:725; Emesis/NG output:2600]  Exam:  Her abdomen is soft nondistended with good bowel sounds. His wound looks good.  Lab Results:  CBC  Recent Labs  11/13/15 0534 11/15/15 0433  WBC 7.6 8.7  HGB 14.6 15.1  HCT 41.3 43.2  PLT 320 363   CMP     Component Value Date/Time   NA 141 11/15/2015 0433   NA 144 01/15/2014 1848   K 3.9 11/15/2015 0433   K 3.7 01/15/2014 1848   CL 104 11/15/2015 0433   CL 108 (H) 01/15/2014 1848   CO2 30 11/15/2015 0433   CO2 23 01/15/2014 1848   GLUCOSE 110 (H) 11/15/2015 0433   GLUCOSE 111 (H) 01/15/2014 1848   BUN 13 11/15/2015 0433   BUN 10 01/15/2014 1848   CREATININE 1.27 (H) 11/15/2015 0433   CREATININE 1.30 01/15/2014 1848   CALCIUM 9.0 11/15/2015 0433   CALCIUM 8.5 01/15/2014 1848   PROT 8.5 (H) 11/05/2015 2339   PROT 7.1 01/15/2014 1848   ALBUMIN 5.1 (H) 11/05/2015 2339   ALBUMIN 3.4 01/15/2014 1848   AST 47 (H) 11/05/2015 2339   AST 33 01/15/2014 1848   ALT 38 11/05/2015 2339   ALT 41 01/15/2014 1848   ALKPHOS 89 11/05/2015 2339   ALKPHOS 89 01/15/2014 1848   BILITOT 1.2 11/05/2015 2339   BILITOT 0.4 01/15/2014 1848   GFRNONAA >60 11/15/2015 0433   GFRNONAA >60 01/15/2014 1848   GFRAA >60 11/15/2015 0433   GFRAA >60 01/15/2014 1848   PT/INR No results for input(s):  LABPROT, INR in the last 72 hours.  Studies/Results: Dg Abd 2 Views  Result Date: 11/14/2015 CLINICAL DATA:  Evaluate.  Healed EXAM: ABDOMEN - 2 VIEW COMPARISON:  None. FINDINGS: The nasogastric tube tip is in the distal stomach. Again noted are skin staples along the ventral aspect of the lower abdomen. Gaseous distension of the bowel loops persist but may be minimally improved. No new findings. IMPRESSION: Stable to improved appearance of ileus. Electronically Signed   By: Signa Kellaylor  Stroud M.D.   On: 11/14/2015 08:22    Assessment/Plan: We will leave his nasogastric tube in overnight plan to remove it tomorrow if he continues to do well consider feeding him if he has no further problems. We will try some Dulcolax to see if that helps stimulate some bowel function.

## 2015-11-16 MED ORDER — PANTOPRAZOLE SODIUM 40 MG PO TBEC
40.0000 mg | DELAYED_RELEASE_TABLET | Freq: Two times a day (BID) | ORAL | Status: DC
  Administered 2015-11-16 – 2015-11-17 (×3): 40 mg via ORAL
  Filled 2015-11-16 (×3): qty 1

## 2015-11-16 MED ORDER — DULOXETINE HCL 60 MG PO CPEP
60.0000 mg | ORAL_CAPSULE | Freq: Every day | ORAL | Status: DC
  Administered 2015-11-16 – 2015-11-17 (×2): 60 mg via ORAL
  Filled 2015-11-16 (×2): qty 1

## 2015-11-16 MED ORDER — HYDROCODONE-ACETAMINOPHEN 5-325 MG PO TABS
1.0000 | ORAL_TABLET | Freq: Four times a day (QID) | ORAL | Status: DC | PRN
  Administered 2015-11-16: 1 via ORAL
  Filled 2015-11-16: qty 1

## 2015-11-16 MED ORDER — BUSPIRONE HCL 5 MG PO TABS
15.0000 mg | ORAL_TABLET | Freq: Every day | ORAL | Status: DC
  Administered 2015-11-16 – 2015-11-17 (×2): 15 mg via ORAL
  Filled 2015-11-16 (×2): qty 3

## 2015-11-16 MED ORDER — HYDROMORPHONE HCL 1 MG/ML IJ SOLN
0.5000 mg | INTRAMUSCULAR | Status: DC | PRN
  Administered 2015-11-16: 0.5 mg via INTRAVENOUS
  Filled 2015-11-16: qty 1

## 2015-11-16 MED ORDER — ALPRAZOLAM 0.25 MG PO TABS
0.2500 mg | ORAL_TABLET | Freq: Every evening | ORAL | Status: DC | PRN
  Administered 2015-11-16: 0.25 mg via ORAL
  Filled 2015-11-16: qty 1

## 2015-11-16 NOTE — Progress Notes (Signed)
Nutrition Follow-up  DOCUMENTATION CODES:   Not applicable  INTERVENTION:  -Recommend adding Ensure Enlive po BID, each supplement provides 350 kcal and 20 grams of protein    NUTRITION DIAGNOSIS:   Inadequate oral intake related to acute illness as evidenced by NPO status.  progressing  GOAL:   Patient will meet greater than or equal to 90% of their needs  Improving  MONITOR:   Diet advancement, Labs, Weight trends  REASON FOR ASSESSMENT:   NPO/Clear Liquid Diet    ASSESSMENT:    Pt with NG tube out.  Passing flatus.  Taking jello and pudding off full liquid tray.   Noted Psych following.  Pt reports normal intake prior to admission and no wt loss  No wt since 9/13  Medications reviewed: dulcolax added, D5 1/2 NS with KCL at 2550ml/hr Labs reviewed: creatinine 1.27, glucose 110   Unable to complete Nutrition-Focused physical exam at this time. Woke pt up at visit entry, sitter at bedside as on suicide watch. Deferred physical exam at this time  Diet Order:  Diet full liquid Room service appropriate? Yes; Fluid consistency: Thin  Skin:  Reviewed, no issues  Last BM:  9/20  Height:   Ht Readings from Last 1 Encounters:  11/08/15 5\' 10"  (1.778 m)    Weight:   Wt Readings from Last 1 Encounters:  11/08/15 203 lb (92.1 kg)    Ideal Body Weight:     BMI:  Body mass index is 29.13 kg/m.  Estimated Nutritional Needs:   Kcal:  2100-2500 kcals  Protein:  105-125 g  Fluid:  >/= 2 L  EDUCATION NEEDS:   No education needs identified at this time  Najma Bozarth B. Freida BusmanAllen, RD, LDN [phone] (pager) Weekend/On-Call pager [phone])

## 2015-11-16 NOTE — Consult Note (Addendum)
[facility] Consult   Reason for Consult:  Consult for 45 year old man with a history of depression and anxiety currently in the hospital for abdominal surgery Referring Physician:  Pat Patrick Patient Identification: Nathaniel Hess MRN:  [id number] Principal Diagnosis: Generalized anxiety disorder Diagnosis:   Patient Active Problem List   Diagnosis Date Noted  . Generalized anxiety disorder [F41.1] 11/13/2015  . Serotonin withdrawal syndrome ([place]) [T50.995A] 11/13/2015  . SBO (small bowel obstruction) (HCC) [K56.69] 11/06/2015    Total Time spent with patient: 20 minutes  Subjective:   Nathaniel Hess is a 45 y.o. male patient admitted with "I'm pretty frustrated".  Follow-up for Thursday, September 21. Patient now has his NG tube out. He has eaten today and is not feeling too bad. Still down and discouraged and anxious but denies any suicidal ideation. Has not been acting out against himself. Able to articulate positive things in his life he is looking forward to and plans for the future.  HPI:  Patient interviewed. Got to speak with his wife as well. Chart reviewed. This is a 40 year old man who is currently in the hospital for abdominal surgery. Had a small bowel obstruction that was relieved surgically. He has continued to have symptoms typical of an ileus for several days afterwards. He has failed withdrawal of nasogastric suction. Patient is unable to take any oral medication as a result and has not had his antidepressant medicine or Xanax. He is growing increasingly frustrated and irritated at his medical care. Patient took advantage of our interview to discuss a wide range of frustrations in his life including long-standing financial worries, frustrations with the way that his wife keeps house, lingering resentment towards his parents, lingering shame and issues regarding his parent's illnesses as well as his anxiety around his own current physical state. Prior to  hospitalization he was taking Xanax at the low dose of 0.25 mg once a day, Cymbalta 60 mg a day and buspirone either 5 or 15 mg once a day. Since being in the hospital he has received intravenous Ativan and is now receiving doses as high as 2 mg every 6 hours and yet remains very anxious. Today he spoke with Dr. Pat Patrick at some length and yet still seems to be perplexed by his condition. He discussed with meal wide range of gastrointestinal diseases that he wanted to have ruled out. He is not sleeping well. He feels depressed. He has had some hopelessness and passive suicidality although without any intent or plan. He is not having any psychotic symptoms or hallucinations. He feels that the Ativan is not providing significant relief.  Social history: Patient is married has one daughter who lives at home. He works as an Clinical biochemist. Obviously he is earning no money while he is in the hospital. Wife is on disability. There were clearly a lot of financial worries and resentments that he openly discussed in front of his wife.  Medical history: He is currently in the hospital for relief of the small bowel obstruction. Having postoperative ileus.  Substance abuse history: Denies alcohol or drug abuse  Past Psychiatric History: Was seeing Dr. Lovie Macadamia for outpatient psychiatric treatment. No history of suicide attempts. No history of hospitalization. Sounds like he had had at least partial response to Cymbalta and BuSpar and low doses of Xanax.  Risk to Self: Is patient at risk for suicide?: No Risk to Others:   Prior Inpatient Therapy:   Prior Outpatient Therapy:    Past Medical History:  Past  Medical History:  Diagnosis Date  . Barrett's esophagus     Past Surgical History:  Procedure Laterality Date  . BOWEL RESECTION N/A 11/08/2015   Procedure: SMALL BOWEL RESECTION Possible;  Surgeon: Florene Glen, MD;  Location: [facility] ORS;  Service: General;  Laterality: N/A;  . ESOPHAGUS SURGERY    .  LAPAROTOMY N/A 11/08/2015   Procedure: EXPLORATORY LAPAROTOMY;  Surgeon: Florene Glen, MD;  Location: [facility] ORS;  Service: General;  Laterality: N/A;  . NISSEN FUNDOPLICATION     Family History: History reviewed. No pertinent family history. Family Psychiatric  History: Sounds like his father had an alcohol problem Social History:  History  Alcohol Use  . 1.8 oz/week  . 3 Cans of beer per week     History  Drug Use No    Social History   Social History  . Marital status: Married    Spouse name: N/A  . Number of children: N/A  . Years of education: N/A   Social History Main Topics  . Smoking status: Never Smoker  . Smokeless tobacco: Never Used  . Alcohol use 1.8 oz/week    3 Cans of beer per week  . Drug use: No  . Sexual activity: Not Asked   Other Topics Concern  . None   Social History Narrative  . None   Additional Social History:    Allergies:  No Known Allergies  Labs:  Results for orders placed or performed during the hospital encounter of 11/05/15 (from the past 48 hour(s))  CBC     Status: None   Collection Time: 11/15/15  4:33 AM  Result Value Ref Range   WBC 8.7 3.8 - 10.6 K/uL   RBC 5.01 4.40 - 5.90 MIL/uL   Hemoglobin 15.1 13.0 - 18.0 g/dL   HCT 43.2 40.0 - 52.0 %   MCV 86.3 80.0 - 100.0 fL   MCH 30.1 26.0 - 34.0 pg   MCHC 34.9 32.0 - 36.0 g/dL   RDW 13.3 11.5 - 14.5 %   Platelets 363 150 - 440 K/uL  Basic metabolic panel     Status: Abnormal   Collection Time: 11/15/15  4:33 AM  Result Value Ref Range   Sodium 141 135 - 145 mmol/L   Potassium 3.9 3.5 - 5.1 mmol/L   Chloride 104 101 - 111 mmol/L   CO2 30 22 - 32 mmol/L   Glucose, Bld 110 (H) 65 - 99 mg/dL   BUN 13 6 - 20 mg/dL   Creatinine, Ser 1.27 (H) 0.61 - 1.24 mg/dL   Calcium 9.0 8.9 - 10.3 mg/dL   GFR calc non Af Amer >60 >60 mL/min   GFR calc Af Amer >60 >60 mL/min    Comment: (NOTE) The eGFR has been calculated using the CKD EPI equation. This calculation has not been  validated in all clinical situations. eGFR's persistently <60 mL/min signify possible Chronic Kidney Disease.    Anion gap 7 5 - 15    Current Facility-Administered Medications  Medication Dose Route Frequency Provider Last Rate Last Dose  . ALPRAZolam (XANAX) tablet 0.25 mg  0.25 mg Oral QHS PRN Gonzella Lex, MD      . busPIRone (BUSPAR) tablet 15 mg  15 mg Oral Daily John T Clapacs, MD      . dextrose 5 % and 0.45 % NaCl with KCl 20 mEq/L infusion   Intravenous Continuous Dia Crawford III, MD 50 mL/hr at 11/16/15 1040 600 mL at 11/16/15 1040  .  DULoxetine (CYMBALTA) DR capsule 60 mg  60 mg Oral Daily Gonzella Lex, MD      . enoxaparin (LOVENOX) injection 40 mg  40 mg Subcutaneous Q24H Dia Crawford III, MD   40 mg at 11/16/15 0526  . HYDROmorphone (DILAUDID) injection 0.5 mg  0.5 mg Intravenous Q4H PRN Dia Crawford III, MD   0.5 mg at 11/16/15 1656  . LORazepam (ATIVAN) injection 1-2 mg  1-2 mg Intravenous Q4H PRN Clayburn Pert, MD   1 mg at 11/16/15 1324  . menthol-cetylpyridinium (CEPACOL) lozenge 3 mg  1 lozenge Oral PRN Dia Crawford III, MD   3 mg at 11/06/15 1548  . ondansetron (ZOFRAN) injection 4 mg  4 mg Intravenous Q4H PRN Jules Husbands, MD   4 mg at 11/11/15 2030   Or  . ondansetron (ZOFRAN-ODT) disintegrating tablet 4 mg  4 mg Oral Q4H PRN Jules Husbands, MD      . pantoprazole (PROTONIX) EC tablet 40 mg  40 mg Oral BID Dia Crawford III, MD   40 mg at 11/16/15 1048    Musculoskeletal: Strength & Muscle Tone: within normal limits Gait & Station: unsteady Patient leans: N/A  Psychiatric Specialty Exam: Physical Exam  Nursing note and vitals reviewed. Constitutional: He appears well-developed and well-nourished.  HENT:  Head: Normocephalic and atraumatic.  Eyes: Conjunctivae are normal. Pupils are equal, round, and reactive to light.  Neck: Normal range of motion.  Cardiovascular: Regular rhythm and normal heart sounds.   Respiratory: Effort normal.  GI:    Musculoskeletal:  Normal range of motion.  Neurological: He is alert.  Skin: Skin is warm and dry.  Psychiatric: His speech is normal and behavior is normal. Judgment and thought content normal. His mood appears not anxious. His affect is blunt. He is not agitated. Thought content is not paranoid. Cognition and memory are normal. He does not express impulsivity. He does not exhibit a depressed mood. He expresses no homicidal and no suicidal ideation.    Review of Systems  Constitutional: Positive for malaise/fatigue.  HENT: Negative.   Eyes: Negative.   Respiratory: Negative.   Cardiovascular: Negative.   Gastrointestinal: Positive for abdominal pain and nausea.  Musculoskeletal: Negative.   Skin: Negative.   Neurological: Negative.   Psychiatric/Behavioral: Positive for depression and suicidal ideas. Negative for hallucinations, memory loss and substance abuse. The patient is nervous/anxious and has insomnia.     Blood pressure 97/74, pulse 93, temperature 98.1 F (36.7 C), temperature source Oral, resp. rate 18, height 5' 10"  (1.778 m), weight 92.1 kg (203 lb), SpO2 98 %.Body mass index is 29.13 kg/m.  General Appearance: Casual  Eye Contact:  Good  Speech:  Clear and Coherent  Volume:  Increased  Mood:  Angry and Irritable  Affect:  Congruent  Thought Process:  Goal Directed  Orientation:  Full (Time, Place, and Person)  Thought Content:  Logical and Rumination  Suicidal Thoughts:  No  Homicidal Thoughts:  No  Memory:  Immediate;   Good Recent;   Good Remote;   Fair  Judgement:  Fair  Insight:  Fair  Psychomotor Activity:  Decreased  Concentration:  Concentration: Fair  Recall:  [employer] of Knowledge:  Fair  Language:  Fair  Akathisia:  No  Handed:  Right  AIMS (if indicated):     Assets:  Communication Skills Desire for Improvement Housing Resilience Social Support  ADL's:  Impaired  Cognition:  WNL  Sleep:  Treatment Plan Summary: Daily contact with patient to  assess and evaluate symptoms and progress in treatment, Medication management and Plan On reevaluation today he continues to be down and sad but he denies that he has any thought intention or plan of actually trying to kill himself. Denies any hopelessness. Able to articulate positive plans for the future. Patient has been educated about treatment of his anxiety disorder and depression and strongly encouraged to seek out psychotherapy when he leaves the hospital. I have restarted his oral medications and discontinue the mirtazapine. Patient is agreeable to the plan of following up with his primary care doctor but seeking other mental health care as needed. Discontinue the one-to-one at this point. Does not need inpatient psychiatric treatment.  Disposition: Patient does not meet criteria for psychiatric inpatient admission. Supportive therapy provided about ongoing stressors.  Alethia Berthold, MD 11/16/2015 5:33 PM

## 2015-11-16 NOTE — Progress Notes (Signed)
Found patient drinking coke; re-instructed patient that he was NPO and not to be drinking anything, only having ice chips; Patient stated that..."I don't care..If this tube doesn't come out today, I want to be transferred and I don't want to hear any bullshit". Windy Carinaurner,Mataeo Ingwersen K, RN7:38 AM 11/16/2015

## 2015-11-17 MED ORDER — HYDROCODONE-ACETAMINOPHEN 5-325 MG PO TABS: 1.0000 | ORAL_TABLET | Freq: Four times a day (QID) | ORAL | 0 refills | Status: DC | PRN

## 2015-11-17 MED ORDER — ALPRAZOLAM 0.25 MG PO TABS: 0.2500 mg | ORAL_TABLET | Freq: Every evening | ORAL | 0 refills | Status: DC | PRN

## 2015-11-17 NOTE — Progress Notes (Signed)
Discharge  Pt was dressed and ready to go when RN entered the room. Pt and family member were able to participate with discharge teaching and given prescriptions to take to pharmacy. PIV removed and pt has no complaints of pain at this time. Pt requested a wheelchair and cart for transport to car.

## 2015-11-20 NOTE — Discharge Summary (Signed)
Physician Discharge Summary  Patient ID: Jeanella FlatterySean K Meath MRN: [id number] DOB/AGE: 1970/11/24 45 y.o.  Admit date: 11/05/2015 Discharge date: 11/20/2015   Discharge Diagnoses:  Principal Problem:   Generalized anxiety disorder Active Problems:   SBO (small bowel obstruction) (HCC)   Serotonin withdrawal syndrome [facility](HCC)   Procedures:Exploratory laparotomy for bowel obstruction  Hospital Course: This is a patient who had had signs of a partial small bowel obstruction possibly related to adhesions. He was treated conservatively but failed to resolve therefore he was taken to the operating room where an adhesion and causing a bowel obstruction was identified lesion was lysed any made a recovery to the point where his NG tube was removed that required replacement secondary to recurrent nausea and vomiting. Ultimately his nasogastric tube was removed and he was discharged in stable condition tolerating a regular diet. Psychiatry and then consult and for some depression issues. He is discharged in stable condition to follow-up in our office in 10 days  Consults: Psychiatry  Disposition: 01-Home or Self Care  Discharge Instructions    Call MD for:  persistant nausea and vomiting    Complete by:  As directed    Call MD for:  redness, tenderness, or signs of infection (pain, swelling, redness, odor or green/yellow discharge around incision site)    Complete by:  As directed    Call MD for:  severe uncontrolled pain    Complete by:  As directed    Call MD for:  temperature >100.4    Complete by:  As directed    Diet - low sodium heart healthy    Complete by:  As directed    Driving Restrictions    Complete by:  As directed    Do not drive while taking pain medication   Increase activity slowly    Complete by:  As directed    Lifting restrictions    Complete by:  As directed    Do not lift anything that bigger than your dinner plate   No dressing needed    Complete by:  As directed    Sexual Activity Restrictions    Complete by:  As directed    He may resume sex at 3 weeks from surgery       Medication List    TAKE these medications   ALPRAZolam 0.25 MG tablet Commonly known as:  XANAX Take 1 tablet (0.25 mg total) by mouth at bedtime as needed for anxiety. What changed:  when to take this  reasons to take this  additional instructions   busPIRone 15 MG tablet Commonly known as:  BUSPAR Take 5 mg by mouth daily.   DULoxetine 60 MG capsule Commonly known as:  CYMBALTA Take 60 mg by mouth daily.   HYDROcodone-acetaminophen 5-325 MG tablet Commonly known as:  NORCO/VICODIN Take 1-2 tablets by mouth every 6 (six) hours as needed for moderate pain.   omeprazole 20 MG capsule Commonly known as:  PRILOSEC Take 20 mg by mouth daily as needed (heartburn/ acid reflux).   traZODone 50 MG tablet Commonly known as:  DESYREL Take 50-100 mg by mouth at bedtime as needed for sleep.      Follow-up Information    [facility] Follow up in 1 week(s).           Lattie Hawichard E Taleigh Gero, MD, FACS

## 2015-11-21 ENCOUNTER — Telehealth: Payer: Self-pay | Admitting: Surgery

## 2015-11-21 NOTE — Telephone Encounter (Signed)
Please call patients wife. Patient had Exploratory laparotomy and lysis of adhesions with Dr Excell Seltzerooper on  11/08/15 - He was admitted from 11/05/15 - 11/20/15. He is prescribed Hydrocodone 5mg  1-2 tablets every 6 hours. He has been taking 2 tablets every 6 hours and it is not helping the pain. He needs something different for pain. Please call to assist.

## 2015-11-22 ENCOUNTER — Observation Stay
Admission: EM | Admit: 2015-11-22 | Discharge: 2015-11-24 | Disposition: A | Discharge status: Home / Self Care | Payer: BLUE CROSS/BLUE SHIELD | Attending: Surgery | Admitting: Surgery

## 2015-11-22 ENCOUNTER — Encounter: Payer: Self-pay | Admitting: Emergency Medicine

## 2015-11-22 DIAGNOSIS — Z9889 Other specified postprocedural states: Secondary | ICD-10-CM | POA: Insufficient documentation

## 2015-11-22 DIAGNOSIS — F411 Generalized anxiety disorder: Secondary | ICD-10-CM | POA: Insufficient documentation

## 2015-11-22 DIAGNOSIS — Z79899 Other long term (current) drug therapy: Secondary | ICD-10-CM | POA: Insufficient documentation

## 2015-11-22 DIAGNOSIS — K5900 Constipation, unspecified: Secondary | ICD-10-CM | POA: Diagnosis present

## 2015-11-22 DIAGNOSIS — K429 Umbilical hernia without obstruction or gangrene: Secondary | ICD-10-CM | POA: Diagnosis not present

## 2015-11-22 DIAGNOSIS — G8918 Other acute postprocedural pain: Secondary | ICD-10-CM

## 2015-11-22 DIAGNOSIS — K2270 Barrett's esophagus without dysplasia: Secondary | ICD-10-CM | POA: Diagnosis not present

## 2015-11-22 DIAGNOSIS — K9189 Other postprocedural complications and disorders of digestive system: Principal | ICD-10-CM | POA: Insufficient documentation

## 2015-11-22 HISTORY — DX: Unspecified intestinal obstruction, unspecified as to partial versus complete obstruction: K56.609

## 2015-11-22 LAB — URINALYSIS COMPLETE WITH MICROSCOPIC (ARMC ONLY)
BILIRUBIN URINE: NEGATIVE
Bacteria, UA: NONE SEEN
Glucose, UA: NEGATIVE mg/dL
HGB URINE DIPSTICK: NEGATIVE
KETONES UR: NEGATIVE mg/dL
LEUKOCYTES UA: NEGATIVE
NITRITE: NEGATIVE
PH: 6 (ref 5.0–8.0)
Protein, ur: NEGATIVE mg/dL
RBC / HPF: NONE SEEN RBC/hpf (ref 0–5)
Specific Gravity, Urine: 1.004 — ABNORMAL LOW (ref 1.005–1.030)
Squamous Epithelial / LPF: NONE SEEN
WBC, UA: NONE SEEN WBC/hpf (ref 0–5)

## 2015-11-22 LAB — CBC
HEMATOCRIT: 39.1 % — AB (ref 40.0–52.0)
Hemoglobin: 14 g/dL (ref 13.0–18.0)
MCH: 30.7 pg (ref 26.0–34.0)
MCHC: 35.8 g/dL (ref 32.0–36.0)
MCV: 85.8 fL (ref 80.0–100.0)
PLATELETS: 434 10*3/uL (ref 150–440)
RBC: 4.55 MIL/uL (ref 4.40–5.90)
RDW: 13.3 % (ref 11.5–14.5)
WBC: 8 10*3/uL (ref 3.8–10.6)

## 2015-11-22 LAB — COMPREHENSIVE METABOLIC PANEL
ALT: 56 U/L (ref 17–63)
AST: 33 U/L (ref 15–41)
Albumin: 3.5 g/dL (ref 3.5–5.0)
Alkaline Phosphatase: 97 U/L (ref 38–126)
Anion gap: 6 (ref 5–15)
BILIRUBIN TOTAL: 0.4 mg/dL (ref 0.3–1.2)
BUN: 5 mg/dL — AB (ref 6–20)
CO2: 24 mmol/L (ref 22–32)
CREATININE: 0.93 mg/dL (ref 0.61–1.24)
Calcium: 8.5 mg/dL — ABNORMAL LOW (ref 8.9–10.3)
Chloride: 107 mmol/L (ref 101–111)
Glucose, Bld: 198 mg/dL — ABNORMAL HIGH (ref 65–99)
POTASSIUM: 3.2 mmol/L — AB (ref 3.5–5.1)
Sodium: 137 mmol/L (ref 135–145)
TOTAL PROTEIN: 6.5 g/dL (ref 6.5–8.1)

## 2015-11-22 LAB — LIPASE, BLOOD: Lipase: 72 U/L — ABNORMAL HIGH (ref 11–51)

## 2015-11-22 MED ORDER — SODIUM CHLORIDE 0.9 % IV BOLUS (SEPSIS)
500.0000 mL | Freq: Once | INTRAVENOUS | Status: AC
  Administered 2015-11-23: 500 mL via INTRAVENOUS

## 2015-11-22 MED ORDER — MORPHINE SULFATE (PF) 4 MG/ML IV SOLN
4.0000 mg | Freq: Once | INTRAVENOUS | Status: AC
  Administered 2015-11-23: 4 mg via INTRAVENOUS
  Filled 2015-11-22: qty 1

## 2015-11-22 MED ORDER — IOPAMIDOL (ISOVUE-300) INJECTION 61%
30.0000 mL | Freq: Once | INTRAVENOUS | Status: AC | PRN
  Administered 2015-11-23: 30 mL via ORAL

## 2015-11-22 NOTE — Telephone Encounter (Signed)
Patient's wife walked in to office at this time to discuss patient and need for further medication.  She states that patient is now nauseated continuously, only having liquid stools and this is occurring 8-10 times daily, and severe abdominal pain. Patient has been taking 2 tabs every 4-6 hours and this has not helped at all.  I explained to patient's wife that she would need to take the patient to the Emergency Room at this time. She verbalizes understanding.

## 2015-11-22 NOTE — ED Triage Notes (Addendum)
[facility] states had surgery 2 weeks ago for bowel obstruction with Dr. Michela PitcherEly, now states unable to manage pain at home, is having infrequent watery stools, 2 in 2 weeks, and feeling bloated after eating.  Pt NAD at this time.

## 2015-11-23 ENCOUNTER — Emergency Department: Payer: BLUE CROSS/BLUE SHIELD

## 2015-11-23 DIAGNOSIS — K5900 Constipation, unspecified: Secondary | ICD-10-CM | POA: Diagnosis present

## 2015-11-23 MED ORDER — DULOXETINE HCL 60 MG PO CPEP
60.0000 mg | ORAL_CAPSULE | Freq: Every day | ORAL | Status: DC
  Administered 2015-11-23 – 2015-11-24 (×2): 60 mg via ORAL
  Filled 2015-11-23 (×2): qty 1

## 2015-11-23 MED ORDER — ONDANSETRON HCL 4 MG/2ML IJ SOLN
4.0000 mg | Freq: Four times a day (QID) | INTRAMUSCULAR | Status: DC | PRN
  Administered 2015-11-23: 4 mg via INTRAVENOUS
  Filled 2015-11-23: qty 2

## 2015-11-23 MED ORDER — MAGNESIUM HYDROXIDE 400 MG/5ML PO SUSP
30.0000 mL | Freq: Once | ORAL | Status: AC
  Administered 2015-11-23: 30 mL via ORAL
  Filled 2015-11-23: qty 30

## 2015-11-23 MED ORDER — LACTATED RINGERS IV SOLN
INTRAVENOUS | Status: DC
  Administered 2015-11-23 – 2015-11-24 (×4): via INTRAVENOUS

## 2015-11-23 MED ORDER — FLEET ENEMA 7-19 GM/118ML RE ENEM
1.0000 | ENEMA | Freq: Once | RECTAL | Status: AC
  Administered 2015-11-24: 1 via RECTAL

## 2015-11-23 MED ORDER — IOPAMIDOL (ISOVUE-300) INJECTION 61%
100.0000 mL | Freq: Once | INTRAVENOUS | Status: AC | PRN
  Administered 2015-11-23: 100 mL via INTRAVENOUS

## 2015-11-23 MED ORDER — KETOROLAC TROMETHAMINE 30 MG/ML IJ SOLN
30.0000 mg | Freq: Four times a day (QID) | INTRAMUSCULAR | Status: DC | PRN
  Administered 2015-11-23: 30 mg via INTRAVENOUS
  Filled 2015-11-23: qty 1

## 2015-11-23 MED ORDER — FLEET ENEMA 7-19 GM/118ML RE ENEM
1.0000 | ENEMA | Freq: Once | RECTAL | Status: AC
Start: 2015-11-23 — End: 2015-11-23
  Administered 2015-11-23: 1 via RECTAL

## 2015-11-23 MED ORDER — TRAZODONE HCL 50 MG PO TABS
50.0000 mg | ORAL_TABLET | Freq: Every evening | ORAL | Status: DC | PRN
  Administered 2015-11-23: 100 mg via ORAL
  Filled 2015-11-23: qty 2

## 2015-11-23 MED ORDER — HEPARIN SODIUM (PORCINE) 5000 UNIT/ML IJ SOLN
5000.0000 [IU] | Freq: Three times a day (TID) | INTRAMUSCULAR | Status: DC
  Administered 2015-11-23 – 2015-11-24 (×5): 5000 [IU] via SUBCUTANEOUS
  Filled 2015-11-23 (×5): qty 1

## 2015-11-23 MED ORDER — POLYETHYLENE GLYCOL 3350 17 G PO PACK
17.0000 g | PACK | Freq: Every day | ORAL | Status: DC
  Administered 2015-11-23: 17 g via ORAL
  Filled 2015-11-23: qty 1

## 2015-11-23 MED ORDER — FLEET ENEMA 7-19 GM/118ML RE ENEM
1.0000 | ENEMA | Freq: Three times a day (TID) | RECTAL | Status: DC
  Administered 2015-11-23 (×2): 1 via RECTAL

## 2015-11-23 MED ORDER — POLYETHYLENE GLYCOL 3350 17 G PO PACK: 17.0000 g | PACK | Freq: Every day | ORAL | 0 refills | Status: DC

## 2015-11-23 MED ORDER — ALPRAZOLAM 0.5 MG PO TABS
0.2500 mg | ORAL_TABLET | Freq: Every evening | ORAL | Status: DC | PRN
  Administered 2015-11-23: 0.25 mg via ORAL
  Filled 2015-11-23: qty 1

## 2015-11-23 MED ORDER — ONDANSETRON HCL 4 MG PO TABS: 4.0000 mg | ORAL_TABLET | Freq: Four times a day (QID) | ORAL | Status: DC | PRN

## 2015-11-23 MED ORDER — HYDROCODONE-ACETAMINOPHEN 5-325 MG PO TABS
1.0000 | ORAL_TABLET | Freq: Four times a day (QID) | ORAL | Status: DC | PRN
  Administered 2015-11-23: 1 via ORAL
  Administered 2015-11-23: 2 via ORAL
  Filled 2015-11-23: qty 2
  Filled 2015-11-23: qty 1

## 2015-11-23 MED ORDER — BUSPIRONE HCL 5 MG PO TABS
5.0000 mg | ORAL_TABLET | Freq: Every day | ORAL | Status: DC
  Administered 2015-11-23 – 2015-11-24 (×2): 5 mg via ORAL
  Filled 2015-11-23 (×2): qty 1

## 2015-11-23 MED ORDER — ONDANSETRON HCL 4 MG/2ML IJ SOLN
4.0000 mg | Freq: Once | INTRAMUSCULAR | Status: AC
  Administered 2015-11-23: 4 mg via INTRAVENOUS
  Filled 2015-11-23: qty 2

## 2015-11-23 MED ORDER — PANTOPRAZOLE SODIUM 40 MG PO TBEC
40.0000 mg | DELAYED_RELEASE_TABLET | Freq: Every day | ORAL | Status: DC
  Administered 2015-11-23 – 2015-11-24 (×2): 40 mg via ORAL
  Filled 2015-11-23 (×2): qty 1

## 2015-11-23 NOTE — Progress Notes (Signed)
11/23/2015 6:21 PM  Pt continues to complain of severe headache pain 8/10 despite administration of PRN P.O. Norco.  Phoned attending physician Dr. Everlene FarrierPabon to ask for further orders or instructions and spoke to RN as he was in surgery.  RN said they will confer with MD and call me back.  Awaiting further instructions.  Bradly Chrisougherty, Gil Ingwersen E, RN

## 2015-11-23 NOTE — ED Notes (Signed)
Pt finished drinking the contrast, CT was notified.

## 2015-11-23 NOTE — Progress Notes (Signed)
Discussed with Dr. Everlene FarrierPabon and with nursing. Patient apparently refused were asked to have the enemas discontinued after having a bowel movement with one enema. No enemas since then and no bowel movements since this morning. So in retrospect she's only had 3 bowel movements in 2 weeks.  Abdomen is soft distended and nontympanitic and nontender  The patient's studies yesterday in the emergency room dictated aggressive therapy for his constipation which is been ignored for about 2 weeks. He had not tried any medications at home and my fear is that with 1 bowel movement he's only evacuated his sigmoid or left colon. He still has significant stool burden to his terminal ileum based on his studies yesterday. I would like to reinstitute the enemas and because he has not had much result from the milk of magnesia I would try some MiraLAX tonight and repeat an enema and repeat another one in the morning to ensure that we have aggressively cleaned him out prior to discharge. Having said that if we can obtain good results he may be able to go home tomorrow.

## 2015-11-23 NOTE — Discharge Instructions (Signed)
Follow-up with Dr. Excell Seltzerooper in 10 days and with primary care in 2 weeks Resume home medications and prescribed medications Use an enema or suppository as needed at home. No heavy lifting

## 2015-11-23 NOTE — Progress Notes (Signed)
Dr.Cooper on the unit making rounds. New orders given verbally to give enema at this time.  Nathaniel Hess,Nathaniel Hess

## 2015-11-23 NOTE — Progress Notes (Signed)
Initial Nutrition Assessment  DOCUMENTATION CODES:   Not applicable  INTERVENTION:  -Monitor intake and diet progression.  -If continues on clear liquid for > 48 hr recommend adding Boost Breeze po TID, each supplement provides 250 kcal and 9 grams of protein -Discussed importance of good nutrition for healing.   NUTRITION DIAGNOSIS:   Inadequate oral intake related to altered GI function as evidenced by per patient/family report.    GOAL:   Patient will meet greater than or equal to 90% of their needs    MONITOR:   Diet advancement, Labs, Weight trends  REASON FOR ASSESSMENT:   Malnutrition Screening Tool    ASSESSMENT:      45 y.o male admitted with severe constipation.  Pt 2 weeks post op from exploratory lap for bowel obstruction due to adhesions (nissen fundoplication).   Past medical history of barrett's esophagus, bowel obstruction  Pt reports fair appetite prior to admission following surgery 2 weeks ago.  Reports eating but not eating as much as normal.    Medications reviewed: fleet enema, protonix. LR at 15725ml/hr Labs reviewed: K 3.2, glucose 198  Nutrition-Focused physical exam completed. Findings are WDL for fat depletion, muscle depletion, and edema.    Diet Order:  Diet clear liquid Room service appropriate? Yes; Fluid consistency: Thin  Skin:  Reviewed, no issues  Last BM:  9/25, distended abdomen  Height:   Ht Readings from Last 1 Encounters:  11/22/15 5\' 10"  (1.778 m)    Weight: Pt reports UBW of 209-210 pounds. Reports was weighed this am and weighed 209 pounds.    Wt Readings from Last 1 Encounters:  11/22/15 198 lb (89.8 kg)    Ideal Body Weight:     BMI:  Body mass index is 28.41 kg/m.  Estimated Nutritional Needs:   Kcal:  1900-2200 kcals/d  Protein:  107-133 g/d  Fluid:  >/= 1.9 L/d  EDUCATION NEEDS:   Education needs addressed  Adriahna Shearman B. Freida BusmanAllen, RD, LDN [phone] (pager) Weekend/On-Call pager  [phone])

## 2015-11-23 NOTE — H&P (Signed)
Nathaniel Hess is an 45 y.o. male.    Chief Complaint: Bloating bloating  HPI: This a patient with bloating he is possibly 2 weeks postop from an exploratory laparotomy for bowel obstruction due to adhesions. He is not having any of the pain that he was having with that episode but has noted bloating some nausea no emesis, he is not able to vomit due to his Nissen fundoplication. He is passing gas but has not had but 2 diarrheal stools in the last 2 weeks.  A workup in the emergency room showed a CT scan showing extensive fecal is a patient and a colon full of stool and gas without signs of bowel obstruction. I was asked to evaluate the patient in the postoperative period denies fevers or chills  Past Medical History:  Diagnosis Date  . Barrett's esophagus   . Bowel obstruction [facility])     Past Surgical History:  Procedure Laterality Date  . BOWEL RESECTION N/A 11/08/2015   Procedure: SMALL BOWEL RESECTION Possible;  Surgeon: Florene Glen, MD;  Location: [facility] ORS;  Service: General;  Laterality: N/A;  . ESOPHAGUS SURGERY    . LAPAROTOMY N/A 11/08/2015   Procedure: EXPLORATORY LAPAROTOMY;  Surgeon: Florene Glen, MD;  Location: [facility] ORS;  Service: General;  Laterality: N/A;  . NISSEN FUNDOPLICATION      History reviewed. No pertinent family history. Social History:  reports that he has never smoked. He has never used smokeless tobacco. He reports that he does not drink alcohol or use drugs.  Allergies: No Known Allergies   (Not in a hospital admission)   Review of Systems  Constitutional: Negative for chills and fever.  HENT: Negative.   Eyes: Negative.   Respiratory: Negative.   Cardiovascular: Negative.   Gastrointestinal: Positive for constipation and nausea. Negative for abdominal pain, blood in stool, diarrhea, heartburn, melena and vomiting.  Genitourinary: Negative.   Musculoskeletal: Negative.   Skin: Negative.   Neurological: Negative.   Endo/Heme/Allergies:  Negative.   Psychiatric/Behavioral: Negative.      Physical Exam:  BP 125/85   Pulse 85   Temp 98.1 F (36.7 C) (Oral)   Resp 16   Ht 5' 10"  (1.778 m)   Wt 198 lb (89.8 kg)   SpO2 98%   BMI 28.41 kg/m   Physical Exam  Constitutional: He is oriented to person, place, and time and well-developed, well-nourished, and in no distress. No distress.  HENT:  Head: Normocephalic and atraumatic.  Eyes: Right eye exhibits no discharge. Left eye exhibits no discharge. No scleral icterus.  Neck: Normal range of motion.  Cardiovascular: Normal rate and regular rhythm.   Pulmonary/Chest: Effort normal. No respiratory distress.  Abdominal: Soft. He exhibits distension. There is no tenderness. There is no rebound and no guarding.  Soft and nontender staples in place with wound with no erythema minimal tympany  Musculoskeletal: Normal range of motion. He exhibits no edema.  Lymphadenopathy:    He has no cervical adenopathy.  Neurological: He is alert and oriented to person, place, and time.  Skin: Skin is warm and dry. He is not diaphoretic. No erythema.  Psychiatric: Mood and affect normal.  Vitals reviewed.       Results for orders placed or performed during the hospital encounter of 11/22/15 (from the past 48 hour(s))  Lipase, blood     Status: Abnormal   Collection Time: 11/22/15  9:20 PM  Result Value Ref Range   Lipase 72 (H) 11 - 51  U/L  Comprehensive metabolic panel     Status: Abnormal   Collection Time: 11/22/15  9:20 PM  Result Value Ref Range   Sodium 137 135 - 145 mmol/L   Potassium 3.2 (L) 3.5 - 5.1 mmol/L   Chloride 107 101 - 111 mmol/L   CO2 24 22 - 32 mmol/L   Glucose, Bld 198 (H) 65 - 99 mg/dL   BUN 5 (L) 6 - 20 mg/dL   Creatinine, Ser 0.93 0.61 - 1.24 mg/dL   Calcium 8.5 (L) 8.9 - 10.3 mg/dL   Total Protein 6.5 6.5 - 8.1 g/dL   Albumin 3.5 3.5 - 5.0 g/dL   AST 33 15 - 41 U/L   ALT 56 17 - 63 U/L   Alkaline Phosphatase 97 38 - 126 U/L   Total Bilirubin  0.4 0.3 - 1.2 mg/dL   GFR calc non Af Amer >60 >60 mL/min   GFR calc Af Amer >60 >60 mL/min    Comment: (NOTE) The eGFR has been calculated using the CKD EPI equation. This calculation has not been validated in all clinical situations. eGFR's persistently <60 mL/min signify possible Chronic Kidney Disease.    Anion gap 6 5 - 15  CBC     Status: Abnormal   Collection Time: 11/22/15  9:20 PM  Result Value Ref Range   WBC 8.0 3.8 - 10.6 K/uL   RBC 4.55 4.40 - 5.90 MIL/uL   Hemoglobin 14.0 13.0 - 18.0 g/dL   HCT 39.1 (L) 40.0 - 52.0 %   MCV 85.8 80.0 - 100.0 fL   MCH 30.7 26.0 - 34.0 pg   MCHC 35.8 32.0 - 36.0 g/dL   RDW 13.3 11.5 - 14.5 %   Platelets 434 150 - 440 K/uL  Urinalysis complete, with microscopic     Status: Abnormal   Collection Time: 11/22/15  9:20 PM  Result Value Ref Range   Color, Urine STRAW (A) YELLOW   APPearance CLEAR (A) CLEAR   Glucose, UA NEGATIVE NEGATIVE mg/dL   Bilirubin Urine NEGATIVE NEGATIVE   Ketones, ur NEGATIVE NEGATIVE mg/dL   Specific Gravity, Urine 1.004 (L) 1.005 - 1.030   Hgb urine dipstick NEGATIVE NEGATIVE   pH 6.0 5.0 - 8.0   Protein, ur NEGATIVE NEGATIVE mg/dL   Nitrite NEGATIVE NEGATIVE   Leukocytes, UA NEGATIVE NEGATIVE   RBC / HPF NONE SEEN 0 - 5 RBC/hpf   WBC, UA NONE SEEN 0 - 5 WBC/hpf   Bacteria, UA NONE SEEN NONE SEEN   Squamous Epithelial / LPF NONE SEEN NONE SEEN   Ct Abdomen Pelvis W Contrast  Result Date: 11/23/2015 CLINICAL DATA:  45 year old male with recent surgery for exploratory laparotomy and lysis of adhesions for small bowel obstruction. Operation presenting with abdominal pain EXAM: CT ABDOMEN AND PELVIS WITH CONTRAST TECHNIQUE: Multidetector CT imaging of the abdomen and pelvis was performed using the standard protocol following bolus administration of intravenous contrast. CONTRAST:  153m ISOVUE-300 IOPAMIDOL (ISOVUE-300) INJECTION 61% COMPARISON:  Multiple abdominal radiographs and CT dating back to 11/06/2015.  FINDINGS: Lower chest: The visualized lung bases are clear. No intra-abdominal free air or free fluid. Hepatobiliary: A 1 cm stable appearing left hepatic hypodense lesion, incompletely characterized, possibly a cyst or hemangioma. The liver is otherwise unremarkable. No intrahepatic biliary ductal dilatation. The gallbladder is unremarkable. Pancreas: Unremarkable. No pancreatic ductal dilatation or surrounding inflammatory changes. Spleen: Normal in size without focal abnormality. Adrenals/Urinary Tract: Adrenal glands are unremarkable. Kidneys are normal, without renal calculi, focal  lesion, or hydronephrosis. Bladder is unremarkable. Stomach/Bowel: There is postsurgical changes of Nissen fundoplication. The stomach is mildly distended. Oral contrast. There are multiple mildly thickened loops of jejunal folds in the left upper abdomen concerning for enteritis. A mildly dilated loop of small bowel in the upper abdomen measures up to 4 cm in diameter. The distal small bowel are decompressed. Contrast however opacified is this decompressed distal loops of small bowel. Findings most likely represent postoperative ileus. An early or partial small-bowel obstruction is less likely but not entirely excluded. Clinical correlation and follow-up recommended. Large amount of stool noted throughout the colon. There is fecalization of loops of distal and terminal ileum. The appendix appears unremarkable. Vascular/Lymphatic: No significant vascular findings are present. No enlarged abdominal or pelvic lymph nodes. Reproductive: The prostate and seminal vesicles are grossly unremarkable. Other: Postsurgical changes of anterior abdominal wall with midline vertical incisional scar and cutaneous staples line. Small fat containing umbilical hernia. No fluid collection or hematoma. Musculoskeletal: No acute or significant osseous findings. IMPRESSION: Findings concerning for enteritis with reactive or postoperative ileus. A partial  or early small-bowel obstruction is less likely but not entirely excluded. Correlation with clinical exam and follow-up recommended. Constipation. Normal appendix. Electronically Signed   By: Anner Crete M.D.   On: 11/23/2015 02:56     Assessment/Plan  CT scan is personally reviewed the patient's colon is full of stool as is his terminal ileum there is gas throughout his colon. I see no signs of an obvious bowel obstruction in this patient is 2 weeks postop exploratory laparotomy and it easy lysis for small bowel obstruction. This is nothing like his initial presentation. He has no pain at this point. I believe he is severely constipated and requires aggressive therapy I offered have him do this at home versus hospitalization and he prefers to be hospitalized and will start aggressive bowel therapy at this point.  Florene Glen, MD, FACS

## 2015-11-23 NOTE — ED Provider Notes (Addendum)
[facility] Emergency Department Provider Note  ____________________________________________   I have reviewed the triage vital signs and the nursing notes.   HISTORY  Chief Complaint Post-op Problem and Abdominal Pain    HPI Nathaniel Hess is a 45 y.o. male who had a recent surgery on 9/13 of this month for respiratory laparotomy with lysis of adhesions for SBO. Patient states that since the surgery, over 2 weeks ago, he has had persistent abdominal discomfort and occasional nausea. He was on narcotic pain medications. According to patient and family he is not vomiting he is eating well. He has had no fevers no dysuria no urinary frequency no other complaint however, he has had only 2 bowel movements and they have been watery. This is over the last 2 weeks according to the patient. In addition, patient has a persistent area umbilical discomfort. He was not on any stool softeners while he was taking the pain medications. He is no longer taking pain medications. He denies any cough shortness of breath leg swelling or other postoperative complaints. He has had mild nausea but no vomiting     Past Medical History:  Diagnosis Date  . Barrett's esophagus   . Bowel obstruction [facility])     Patient Active Problem List   Diagnosis Date Noted  . Generalized anxiety disorder 11/13/2015  . Serotonin withdrawal syndrome (HCC) 11/13/2015  . SBO (small bowel obstruction) (HCC) 11/06/2015    Past Surgical History:  Procedure Laterality Date  . BOWEL RESECTION N/A 11/08/2015   Procedure: SMALL BOWEL RESECTION Possible;  Surgeon: Lattie Haw, MD;  Location: [facility] ORS;  Service: General;  Laterality: N/A;  . ESOPHAGUS SURGERY    . LAPAROTOMY N/A 11/08/2015   Procedure: EXPLORATORY LAPAROTOMY;  Surgeon: Lattie Haw, MD;  Location: [facility] ORS;  Service: General;  Laterality: N/A;  . NISSEN FUNDOPLICATION      Prior to Admission medications   Medication Sig Start Date  End Date Taking? Authorizing Provider  ALPRAZolam (XANAX) 0.25 MG tablet Take 1 tablet (0.25 mg total) by mouth at bedtime as needed for anxiety. 11/17/15  Yes Tiney Rouge III, MD  busPIRone (BUSPAR) 15 MG tablet Take 5 mg by mouth daily.    Yes Historical Provider, MD  DULoxetine (CYMBALTA) 60 MG capsule Take 60 mg by mouth daily.    Yes Historical Provider, MD  HYDROcodone-acetaminophen (NORCO/VICODIN) 5-325 MG tablet Take 1-2 tablets by mouth every 6 (six) hours as needed for moderate pain. 11/17/15  Yes Tiney Rouge III, MD  omeprazole (PRILOSEC) 20 MG capsule Take 20 mg by mouth daily as needed (heartburn/ acid reflux).   Yes Historical Provider, MD  traZODone (DESYREL) 50 MG tablet Take 50-100 mg by mouth at bedtime as needed for sleep.   Yes Historical Provider, MD    Allergies Review of patient's allergies indicates no known allergies.  History reviewed. No pertinent family history.  Social History Social History  Substance Use Topics  . Smoking status: Never Smoker  . Smokeless tobacco: Never Used  . Alcohol use No    Review of Systems Constitutional: No fever/chills Eyes: No visual changes. ENT: No sore throat. No stiff neck no neck pain Cardiovascular: Denies chest pain. Respiratory: Denies shortness of breath. Gastrointestinal:  See history of present illness Genitourinary: Negative for dysuria. Musculoskeletal: Negative lower extremity swelling Skin: Negative for rash. Neurological: Negative for severe headaches, focal weakness or numbness. 10-point ROS otherwise negative.  ____________________________________________   PHYSICAL EXAM:  VITAL SIGNS: ED Triage  Vitals  Enc Vitals Group     BP 11/22/15 2116 120/76     Pulse Rate 11/22/15 2116 (!) 106     Resp 11/22/15 2116 18     Temp 11/22/15 2116 98.6 F (37 C)     Temp Source 11/22/15 2116 Oral     SpO2 11/22/15 2116 97 %     Weight 11/22/15 2116 198 lb (89.8 kg)     Height 11/22/15 2116 5\' 10"  (1.778 m)      Head Circumference --      Peak Flow --      Pain Score 11/22/15 2117 7     Pain Loc --      Pain Edu? --      Excl. in GC? --     Constitutional: Alert and oriented. Well appearing and in no acute distress. Eyes: Conjunctivae are normal. PERRL. EOMI. Head: Atraumatic. Nose: No congestion/rhinnorhea. Mouth/Throat: Mucous membranes are moist.  Oropharynx non-erythematous. Neck: No stridor.   Nontender with no meningismus Cardiovascular: Normal rate, regular rhythm. Grossly normal heart sounds.  Good peripheral circulation. Respiratory: Normal respiratory effort.  No retractions. Lungs CTAB. Abdominal: Soft with minimal tenderness palpation around the umbilicus. Distractible. Nonsurgical exam with no peritoneal signs. No guarding no rebound. Incision is clean dry and intact with staples still in place. Back:  There is no focal tenderness or step off.  there is no midline tenderness there are no lesions noted. there is no CVA tenderness Musculoskeletal: No lower extremity tenderness, no upper extremity tenderness. No joint effusions, no DVT signs strong distal pulses no edema Neurologic:  Normal speech and language. No gross focal neurologic deficits are appreciated.  Skin:  Skin is warm, dry and intact. No rash noted. Psychiatric: Mood and affect are normal. Speech and behavior are normal.  ____________________________________________   LABS (all labs ordered are listed, but only abnormal results are displayed)  Labs Reviewed  LIPASE, BLOOD - Abnormal; Notable for the following:       Result Value   Lipase 72 (*)    All other components within normal limits  COMPREHENSIVE METABOLIC PANEL - Abnormal; Notable for the following:    Potassium 3.2 (*)    Glucose, Bld 198 (*)    BUN 5 (*)    Calcium 8.5 (*)    All other components within normal limits  CBC - Abnormal; Notable for the following:    HCT 39.1 (*)    All other components within normal limits  URINALYSIS COMPLETEWITH  MICROSCOPIC ([facility] ONLY) - Abnormal; Notable for the following:    Color, Urine STRAW (*)    APPearance CLEAR (*)    Specific Gravity, Urine 1.004 (*)    All other components within normal limits   ____________________________________________  EKG  I personally interpreted any EKGs ordered by me or triage  ____________________________________________  RADIOLOGY  I reviewed any imaging ordered by me or triage that were performed during my shift and, if possible, patient and/or family made aware of any abnormal findings. ____________________________________________   PROCEDURES  Procedure(s) performed: None  Procedures  Critical Care performed: None  ____________________________________________   INITIAL IMPRESSION / ASSESSMENT AND PLAN / ED COURSE  Pertinent labs & imaging results that were available during my care of the patient were reviewed by me and considered in my medical decision making (see chart for details).  Patient with persistent abdominal pain nausea and disruption in stooling since surgery over 2 weeks ago. He feels very constipated. Given recent surgery  ongoing discomfort and ongoing nausea even though he is eating and drinking well, we will obtain a CT scan to rule out other intra-abdominal pathology. If this is constipation will address that. Reassuring exam.  ----------------------------------------- 3:19 AM on 11/23/2015 -----------------------------------------  Patient resting comfortably, Dr. Earmon Phoenix any surgery but he will come evaluate the patient after he is done.  Clinical Course   ____________________________________________   FINAL CLINICAL IMPRESSION(S) / ED DIAGNOSES  Final diagnoses:  None      This chart was dictated using voice recognition software.  Despite best efforts to proofread,  errors can occur which can change meaning.      Jeanmarie Plant, MD 11/23/15 0006    Jeanmarie Plant, MD 11/23/15 [phone]

## 2015-11-24 ENCOUNTER — Observation Stay: Payer: BLUE CROSS/BLUE SHIELD

## 2015-11-24 ENCOUNTER — Encounter: Payer: BLUE CROSS/BLUE SHIELD | Admitting: Surgery

## 2015-11-24 MED ORDER — POLYETHYLENE GLYCOL 3350 17 G PO PACK
17.0000 g | PACK | Freq: Two times a day (BID) | ORAL | Status: DC
  Administered 2015-11-24: 17 g via ORAL
  Filled 2015-11-24: qty 1

## 2015-11-24 MED ORDER — MAGNESIUM CITRATE PO SOLN
1.0000 | Freq: Once | ORAL | Status: AC
  Administered 2015-11-24: 1 via ORAL
  Filled 2015-11-24: qty 296

## 2015-11-24 NOTE — Progress Notes (Signed)
Pt d/c to home today.  V removed intact.  Rx's given to pt w/all questions and concerns addressed.  D/C paperwork reviewed and education provided with all questions and concerns addressed.  Pt wife at bedside for home transport.   

## 2015-11-26 NOTE — Discharge Summary (Signed)
  Patient ID: Jeanella FlatterySean K Lefever MRN: [id number] DOB/AGE: 06/08/1970 45 y.o.  Admit date: 11/22/2015 Discharge date: 11/24/15   Discharge Diagnoses:  Active Problems:   Constipation   Procedures:none  Hospital Course: This is a 45 year old male with a prior history of small bowel obstruction requiring exploratory laparotomy and lysis of adhesions by Dr. Excell Seltzerooper. He presented to the emergency room complaining of abdominal pain and constipation. He was seen and examined there was no evidence of surgical complication but there was significant evidence of fecal in the colon requiring evacuation. He was admitted for aggressive control of constipation with mag citrate, fleets enema and MiraLAX. Had a good response to the laxative and to the enemas and at the time of discharge he was ambulating he was in no acute distress his vital signs were stable and he was tolerating a regular diet. His physical exam at the time of discharge showed a male in no acute distress, awake, alert. Abdomen: Soft, nontender, incision healing well without evidence of infection. No peritonitis. Extremities: No edema. Condition at DC is stable  Disposition: 01-Home or Self Care     Medication List    TAKE these medications   ALPRAZolam 0.25 MG tablet Commonly known as:  XANAX Take 1 tablet (0.25 mg total) by mouth at bedtime as needed for anxiety.   busPIRone 15 MG tablet Commonly known as:  BUSPAR Take 5 mg by mouth daily.   DULoxetine 60 MG capsule Commonly known as:  CYMBALTA Take 60 mg by mouth daily.   HYDROcodone-acetaminophen 5-325 MG tablet Commonly known as:  NORCO/VICODIN Take 1-2 tablets by mouth every 6 (six) hours as needed for moderate pain.   omeprazole 20 MG capsule Commonly known as:  PRILOSEC Take 20 mg by mouth daily as needed (heartburn/ acid reflux).   polyethylene glycol packet Commonly known as:  MIRALAX / GLYCOLAX Take 17 g by mouth daily.   traZODone 50 MG tablet Commonly known  as:  DESYREL Take 50-100 mg by mouth at bedtime as needed for sleep.      Follow-up Information    Dionne Miloichard Cooper, MD. Go on 12/05/2015.   Specialty:  Surgery Why:  at 9:45am in the [place] office. Contact information: [address] [place] [address] [phone]            Sterling Bigiego Eben Choinski, MD FACS

## 2015-11-27 ENCOUNTER — Telehealth: Payer: Self-pay

## 2015-11-27 NOTE — Telephone Encounter (Signed)
Patient's short-term disability was filled out and faxed. Patient's FMLA Form was filled out and ready for pick up at the [place] office.

## 2015-12-05 ENCOUNTER — Ambulatory Visit (INDEPENDENT_AMBULATORY_CARE_PROVIDER_SITE_OTHER): Payer: BLUE CROSS/BLUE SHIELD | Admitting: Surgery

## 2015-12-05 ENCOUNTER — Encounter: Payer: Self-pay | Admitting: Surgery

## 2015-12-05 VITALS — BP 133/87 | HR 112 | Temp 98.0°F | Ht 70.0 in | Wt 207.0 lb

## 2015-12-05 DIAGNOSIS — K5903 Drug induced constipation: Secondary | ICD-10-CM

## 2015-12-05 DIAGNOSIS — K56609 Unspecified intestinal obstruction, unspecified as to partial versus complete obstruction: Secondary | ICD-10-CM | POA: Diagnosis not present

## 2015-12-05 NOTE — Progress Notes (Signed)
12/05/2015  HPI: Patient presents today almost 4 weeks postoperatively from expiratory laparotomy for small bowel obstruction. He had been admitted in between for constipation which was relieved with a bowel regimen in the hospital. Currently she reports that he's been having occasional pains when he bends down or stretches in a different way and also having bowel movements every other day whereas his baseline at home was at least once a day prior to his small bowel obstruction episode. He has been taking his MiraLAX only every so often. Otherwise denies any fevers, chills, chest pain, shortness of breath, nausea, vomiting, or any other abdominal pain.  Vital signs: BP 133/87   Pulse (!) 112   Temp 98 F (36.7 C) (Oral)   Ht 5\' 10"  (1.778 m)   Wt 93.9 kg (207 lb)   BMI 29.70 kg/m    Physical Exam: Constitutional: No acute distress Abdomen: Soft, nondistended, nontender to palpation. Midline incision is clean dry and intact and healed well with no evidence of infection.  Assessment/Plan: This a 45 year old male status post [name] laparotomy for small bowel obstruction with a subsequent admission for constipation. Currently the patient is doing well and is now almost 4 weeks from his surgery.  Have recommended to the patient that he continues taking his MiraLAX daily in order to have daily bowel movements so he does not become constipated again goes back to his baseline. Have also recommended the patient can wear a an abdominal binder for comfort if that helps with any of the symptoms that he is having when he bends or stretches. He had been on FMLA and disability from this surgery and after discussing with the patient he should be able to return to work on Monday, October 23 with full duties and no further lifting restrictions. The patient understands this plan and all his pressures have been answered. The patient may follow-up with us on an as-needed basis from now on.   Nathaniel IllJose Hess [name],  MD [facility]

## 2015-12-05 NOTE — Patient Instructions (Signed)
If you notice any abdominal pressure you can wear an abdominal binder. Please use Miralax daily to avoid constipation. We will provide you a return to work note to return on 12/18/15 with no restrictions.  Please call our office if you have any questions or concerns.

## 2016-02-29 ENCOUNTER — Ambulatory Visit: Payer: Self-pay | Admitting: Surgery

## 2016-09-24 ENCOUNTER — Encounter: Payer: Self-pay | Admitting: Nurse Practitioner

## 2016-09-24 ENCOUNTER — Ambulatory Visit (INDEPENDENT_AMBULATORY_CARE_PROVIDER_SITE_OTHER): Payer: BLUE CROSS/BLUE SHIELD | Admitting: Nurse Practitioner

## 2016-09-24 VITALS — BP 113/75 | HR 75 | Ht 70.0 in | Wt 205.0 lb

## 2016-09-24 DIAGNOSIS — F332 Major depressive disorder, recurrent severe without psychotic features: Secondary | ICD-10-CM | POA: Diagnosis not present

## 2016-09-24 DIAGNOSIS — F32A Depression, unspecified: Secondary | ICD-10-CM | POA: Insufficient documentation

## 2016-09-24 DIAGNOSIS — F419 Anxiety disorder, unspecified: Secondary | ICD-10-CM

## 2016-09-24 DIAGNOSIS — F329 Major depressive disorder, single episode, unspecified: Secondary | ICD-10-CM | POA: Insufficient documentation

## 2016-09-24 MED ORDER — BUSPIRONE HCL 10 MG PO TABS: 20.0000 mg | ORAL_TABLET | Freq: Two times a day (BID) | ORAL | 1 refills | Status: DC

## 2016-09-24 MED ORDER — SERTRALINE HCL 50 MG PO TABS: 100.0000 mg | ORAL_TABLET | Freq: Every day | ORAL | 1 refills | Status: DC

## 2016-09-24 NOTE — Patient Instructions (Addendum)
Nathaniel Hess, Thank you for coming in to clinic today.  1. For your anxiety and depression - Take buspirone 20 mg daily (two 10 mg tablets). - Take sertraline 100 mg daily (two 50 mg tablets).  Recommend counseling. PSYCHIATRY / THERAPY-COUSENLING Self Referral [facility] (All [facility]) [address], [address] [place], [id number] Phone: [phone] (Option 1) [email or web address]   [facility]) [place] [address], [place], [place] [address] Phone: [phone]  [facility], available walk-in 9am-4pm M-F [address] [place], [place] [address] Hours: 9am - 4pm (M-F, walk in available) [phone]  ----------------------------------------------------------------- [facility] ONLY Self Referral: 1. [facility]   Address: [address], [place], [place] [address] Hours: Open today  9AM-7PM Phone: [phone]  2. Anell Barrheryl [address], [facility]  - [facility] Address: [address], [place], [place] [address] Phone: [phone]   Please schedule a follow-up appointment with Nathaniel Hess, AGNP. Return in about 6 weeks (around 11/05/2016) for anxiety and depression.  If you have any other questions or concerns, please feel free to call the clinic or send a message through MyChart. You may also schedule an earlier appointment if necessary.  You will receive a survey after today's visit either digitally by e-mail or paper by [employer] mail. Your experiences and feedback matter to us.  Please respond so we know how we are doing as we provide care for you.   Nathaniel McardleLauren Adonai Selsor, DNP, AGNP-BC Adult Gerontology Nurse Practitioner [facility]

## 2016-09-24 NOTE — Progress Notes (Signed)
Subjective:    Patient ID: Nathaniel Hess, male    DOB: 09-04-1970, 46 y.o.   MRN: [id number]  Nathaniel Hess is a 46 y.o. male presenting on 09/24/2016 for Establish Care (anxiety, depression, and panic attacks )  Wife accompanies pt to clinic today.  HPI Establish Care New Provider Pt last seen by PCP 5 months ago.  Obtain records from [facility].    Anxiety Chronic condition w/o controlled symptoms.  Pt notes he feels it has never been truly controlled since initial diagnosis.    Currently only taking buspirone 30 mg once daily and Cymbalta 60 mg once daily. Is currently prescribed trazodone and alprazolam, but is not taking these.  Has tried Zoloft in past and feels it is working better than cymbalta.  Has been on benzo recently and requests refill.  Atarax and Trazodone have caused significant sleepiness.    - Pt notes significant irritability, excessive sleepiness (third shift work contributes, but sleeps all day when not at work). Sleeps intentionally to avoid his thoughts.   - Pt has panic attacks w/ periods of significant anxiety with heart racing, hives/itching (2x per week at least). - He does note SI 1-2 days per week.  He does not have a plan to carry out SI when it arises. - Has has counseling in past, but did not find it beneficial. - Pt is also currently self-medicating up to 3x weekly with marijuana.  GAD 7 : Generalized Anxiety Score 09/24/2016  Nervous, Anxious, on Edge 3  Control/stop worrying 3  Worry too much - different things 3  Trouble relaxing 3  Restless 0  Easily annoyed or irritable 3  Afraid - awful might happen 0  Total GAD 7 Score 15  Anxiety Difficulty Very difficult    Depression screen PHQ 2/9 09/24/2016  Decreased Interest 3  Down, Depressed, Hopeless 3  PHQ - 2 Score 6  Altered sleeping 3  Tired, decreased energy 3  Change in appetite 2  Feeling bad or failure about yourself  3  Trouble concentrating 2  Moving slowly or  fidgety/restless 0  Suicidal thoughts 1  PHQ-9 Score 20    Past Medical History:  Diagnosis Date  . Anxiety   . Barrett esophagus   . Barrett's esophagus   . Bowel obstruction (HCC)   . Depression   . GERD (gastroesophageal reflux disease)    Past Surgical History:  Procedure Laterality Date  . BOWEL RESECTION N/A 11/08/2015   Procedure: SMALL BOWEL RESECTION Possible;  Surgeon: Lattie Haw, MD;  Location: [facility] ORS;  Service: General;  Laterality: N/A;  . ESOPHAGUS SURGERY    . LAPAROTOMY N/A 11/08/2015   Procedure: EXPLORATORY LAPAROTOMY;  Surgeon: Lattie Haw, MD;  Location: [facility] ORS;  Service: General;  Laterality: N/A;  . NISSEN FUNDOPLICATION     Social History   Social History  . Marital status: Married    Spouse name: N/A  . Number of children: N/A  . Years of education: N/A   Occupational History  . Not on file.   Social History Main Topics  . Smoking status: Never Smoker  . Smokeless tobacco: Never Used  . Alcohol use 1.8 oz/week    3 Cans of beer per week     Comment: occasional  . Drug use: Yes    Frequency: 3.0 times per week    Types: Marijuana  . Sexual activity: Yes   Other Topics Concern  . Not on file  Social History Narrative  . No narrative on file   Family History  Problem Relation Age of Onset  . Depression Mother   . COPD Father   . Drug abuse Father   . Heart disease Father   . Colon polyps Father   . Diabetes Sister   . Diabetes Maternal Grandmother   . Stroke Maternal Grandfather   . Heart attack Paternal Grandfather   . Prostate cancer Neg Hx   . Colon cancer Neg Hx    Current Outpatient Prescriptions on File Prior to Visit  Medication Sig  . polyethylene glycol (MIRALAX / GLYCOLAX) packet Take 17 g by mouth daily.  Marland Kitchen ALPRAZolam (XANAX) 0.25 MG tablet Take 1 tablet (0.25 mg total) by mouth at bedtime as needed for anxiety. (Patient not taking: Reported on 09/24/2016)   No current facility-administered medications  on file prior to visit.     Review of Systems  Gastrointestinal: Positive for constipation.  Neurological: Negative for tremors and seizures.  Psychiatric/Behavioral: Positive for sleep disturbance and suicidal ideas. The patient is nervous/anxious.   All other systems reviewed and are negative.  Per HPI unless specifically indicated above      Objective:    BP 113/75 (BP Location: Right Arm, Patient Position: Sitting, Cuff Size: Large)   Pulse 75   Ht 5\' 10"  (1.778 m)   Wt 205 lb (93 kg)   BMI 29.41 kg/m   Wt Readings from Last 3 Encounters:  09/24/16 205 lb (93 kg)  12/05/15 207 lb (93.9 kg)  11/22/15 198 lb (89.8 kg)    Physical Exam General - healthy, well-appearing, NAD HEENT - Normocephalic, atraumatic Neck - supple, non-tender, no LAD, no thyromegaly, no carotid bruit Heart - RRR, no murmurs heard Lungs - Clear throughout all lobes, no wheezing, crackles, or rhonchi. Normal work of breathing. Extremeties - non-tender, no edema, cap refill < 2 seconds, peripheral pulses intact +2 bilaterally Skin - warm, dry Neuro - awake, alert, oriented x3, normal gait, no tremor Psych - Normal mood and affect, normal behavior    Results for orders placed or performed during the hospital encounter of 11/22/15  Lipase, blood  Result Value Ref Range   Lipase 72 (H) 11 - 51 U/L  Comprehensive metabolic panel  Result Value Ref Range   Sodium 137 135 - 145 mmol/L   Potassium 3.2 (L) 3.5 - 5.1 mmol/L   Chloride 107 101 - 111 mmol/L   CO2 24 22 - 32 mmol/L   Glucose, Bld 198 (H) 65 - 99 mg/dL   BUN 5 (L) 6 - 20 mg/dL   Creatinine, Ser 3.08 0.61 - 1.24 mg/dL   Calcium 8.5 (L) 8.9 - 10.3 mg/dL   Total Protein 6.5 6.5 - 8.1 g/dL   Albumin 3.5 3.5 - 5.0 g/dL   AST 33 15 - 41 U/L   ALT 56 17 - 63 U/L   Alkaline Phosphatase 97 38 - 126 U/L   Total Bilirubin 0.4 0.3 - 1.2 mg/dL   GFR calc non Af Amer >60 >60 mL/min   GFR calc Af Amer >60 >60 mL/min   Anion gap 6 5 - 15  CBC    Result Value Ref Range   WBC 8.0 3.8 - 10.6 K/uL   RBC 4.55 4.40 - 5.90 MIL/uL   Hemoglobin 14.0 13.0 - 18.0 g/dL   HCT 65.7 (L) 84.6 - 96.2 %   MCV 85.8 80.0 - 100.0 fL   MCH 30.7 26.0 - 34.0 pg  MCHC 35.8 32.0 - 36.0 g/dL   RDW 47.813.3 29.511.5 - 62.114.5 %   Platelets 434 150 - 440 K/uL  Urinalysis complete, with microscopic  Result Value Ref Range   Color, Urine STRAW (A) YELLOW   APPearance CLEAR (A) CLEAR   Glucose, UA NEGATIVE NEGATIVE mg/dL   Bilirubin Urine NEGATIVE NEGATIVE   Ketones, ur NEGATIVE NEGATIVE mg/dL   Specific Gravity, Urine 1.004 (L) 1.005 - 1.030   Hgb urine dipstick NEGATIVE NEGATIVE   pH 6.0 5.0 - 8.0   Protein, ur NEGATIVE NEGATIVE mg/dL   Nitrite NEGATIVE NEGATIVE   Leukocytes, UA NEGATIVE NEGATIVE   RBC / HPF NONE SEEN 0 - 5 RBC/hpf   WBC, UA NONE SEEN 0 - 5 WBC/hpf   Bacteria, UA NONE SEEN NONE SEEN   Squamous Epithelial / LPF NONE SEEN NONE SEEN      Assessment & Plan:   Problem List Items Addressed This Visit      Other   Anxiety - Primary   Depression    Uncontrolled w/ GAD7 = 15 and PHQ9 = 20.  Pt w/ long history of anxiety and depression on multiple medications in past.  None seem to have been used at maximum tolerated/recommended doses.  Pt does not currently have SI, but has SI without plan regularly.  Plan: 1. Discussed benzodiazepine use in anxiety.  None to be provided at this time. 2. Stop taking cymbalta and start taking sertraline 100 mg once daily. 3. Change buspirone doses: take 20 mg in am and 20 mg in pm.  (increase from 30 mg daily to 40 mg daily). 4. Discussed brief intervention of deep breathing and body scan. 5. Encouraged pt to seek counseling.  Contact information provided. 6. May need referral to psychiatry. 7. Reviewed serotonin syndrome emergency and if SI w/ plan seek care at ED. 8. Follow up 6 weeks or sooner if needed.      Relevant Medications   LORazepam (ATIVAN) 0.5 MG tablet   traZODone (DESYREL) 50 MG tablet    sertraline (ZOLOFT) 50 MG tablet   busPIRone (BUSPAR) 10 MG tablet      Meds ordered this encounter  Medications  . DISCONTD: LORazepam (ATIVAN) 0.5 MG tablet    Sig: TAKE 1 TABLET BY MOUTH EVERY 12 HOURS AS NEEDED FOR ANXIETY  . DISCONTD: sertraline (ZOLOFT) 50 MG tablet    Sig: Take by mouth.  . DISCONTD: busPIRone (BUSPAR) 15 MG tablet    Sig: TAKE 1/2-1 TABS BY MOUTH 1-2 TIMES A DAY  . traZODone (DESYREL) 50 MG tablet    Sig: START 1 TABLET BY MOUTH NIGHTLY. CAN INCREASE TO 2 OR 3 TABS A NIGHT EVERY 2-3 WEEKS IF NEEDED.  . hydrALAZINE (APRESOLINE) 10 MG tablet    Sig: Take 10 mg by mouth at bedtime as needed.  . sertraline (ZOLOFT) 50 MG tablet    Sig: Take 2 tablets (100 mg total) by mouth daily.    Dispense:  60 tablet    Refill:  1    Order Specific Question:   Supervising Provider    Answer:   Smitty CordsKARAMALEGOS, ALEXANDER J [[id number]]  . busPIRone (BUSPAR) 10 MG tablet    Sig: Take 2 tablets (20 mg total) by mouth 2 (two) times daily.    Dispense:  60 tablet    Refill:  1    Order Specific Question:   Supervising Provider    Answer:   Smitty CordsKARAMALEGOS, ALEXANDER J [[id number]]      Follow  up plan: Return in about 6 weeks (around 11/05/2016) for anxiety and depression.  Wilhelmina McardleLauren Cortez Flippen, DNP, AGPCNP-BC Adult Gerontology Primary Care Nurse Practitioner [facility] 09/24/2016, 3:25 PM

## 2016-09-24 NOTE — Progress Notes (Signed)
I have reviewed this encounter including the documentation in this note and/or discussed this patient with the provider, Wilhelmina McardleLauren Kennedy, AGPCNP-BC. I am certifying that I agree with the content of this note as supervising physician.  Saralyn PilarAlexander Aviendha Azbell, DO [facility] 09/24/2016, 5:34 PM

## 2016-09-24 NOTE — Assessment & Plan Note (Signed)
Uncontrolled w/ GAD7 = 15 and PHQ9 = 20.  Pt w/ long history of anxiety and depression on multiple medications in past.  None seem to have been used at maximum tolerated/recommended doses.  Pt does not currently have SI, but has SI without plan regularly.  Plan: 1. Discussed benzodiazepine use in anxiety.  None to be provided at this time. 2. Stop taking cymbalta and start taking sertraline 100 mg once daily. 3. Change buspirone doses: take 20 mg in am and 20 mg in pm.  (increase from 30 mg daily to 40 mg daily). 4. Discussed brief intervention of deep breathing and body scan. 5. Encouraged pt to seek counseling.  Contact information provided. 6. May need referral to psychiatry. 7. Reviewed serotonin syndrome emergency and if SI w/ plan seek care at ED. 8. Follow up 6 weeks or sooner if needed.

## 2016-10-25 ENCOUNTER — Other Ambulatory Visit: Payer: Self-pay | Admitting: Surgery

## 2016-10-29 ENCOUNTER — Ambulatory Visit: Payer: Self-pay | Admitting: Nurse Practitioner

## 2016-10-30 ENCOUNTER — Other Ambulatory Visit: Payer: Self-pay

## 2016-10-30 DIAGNOSIS — K56609 Unspecified intestinal obstruction, unspecified as to partial versus complete obstruction: Secondary | ICD-10-CM

## 2016-10-30 MED ORDER — POLYETHYLENE GLYCOL 3350 17 G PO PACK: 17.0000 g | PACK | Freq: Every day | ORAL | 0 refills | Status: DC

## 2016-11-05 ENCOUNTER — Ambulatory Visit: Payer: Self-pay | Admitting: Nurse Practitioner

## 2016-11-19 ENCOUNTER — Other Ambulatory Visit: Payer: Self-pay | Admitting: Nurse Practitioner

## 2016-11-19 DIAGNOSIS — F411 Generalized anxiety disorder: Secondary | ICD-10-CM

## 2016-11-19 DIAGNOSIS — F332 Major depressive disorder, recurrent severe without psychotic features: Secondary | ICD-10-CM

## 2016-11-19 MED ORDER — SERTRALINE HCL 100 MG PO TABS: 100.0000 mg | ORAL_TABLET | Freq: Every day | ORAL | 0 refills | Status: DC

## 2016-11-19 NOTE — Telephone Encounter (Signed)
Pt will need appointment before any additional refills.

## 2017-09-21 IMAGING — CT CT ABD-PELV W/ CM
2 of 5 series · 15 of 46 positions shown, 17 images · IV contrast (APPLIED)
Comparison: Multiple abdominal radiographs and CT dating back to
11/06/2015.

CLINICAL DATA: 44-year-old male with recent surgery for exploratory
laparotomy and lysis of adhesions for small bowel obstruction.
Operation presenting with abdominal pain

EXAM:
CT ABDOMEN AND PELVIS WITH CONTRAST
TECHNIQUE: Multidetector CT imaging of the abdomen and pelvis was performed
using the standard protocol following bolus administration of
intravenous contrast.
CONTRAST:  100mL [id number] IOPAMIDOL ([id number]-TAA) INJECTION 61%

[Series 2: axial st · axial · 0.82mm/px · z∈[-1079,-609]mm · 12 of 110 slices shown, 14 images]
[im 8/110  soft-tissue]
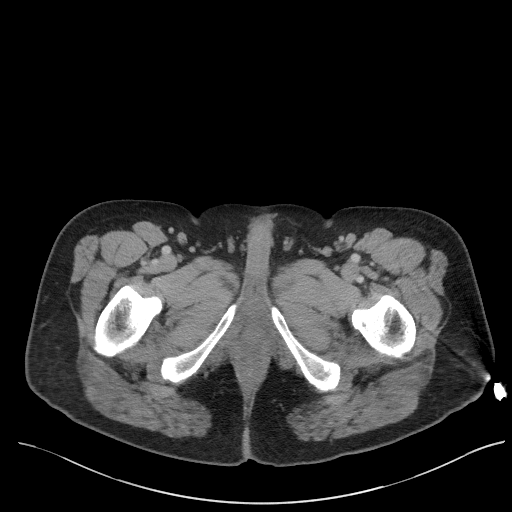
[im 8/110  bone]
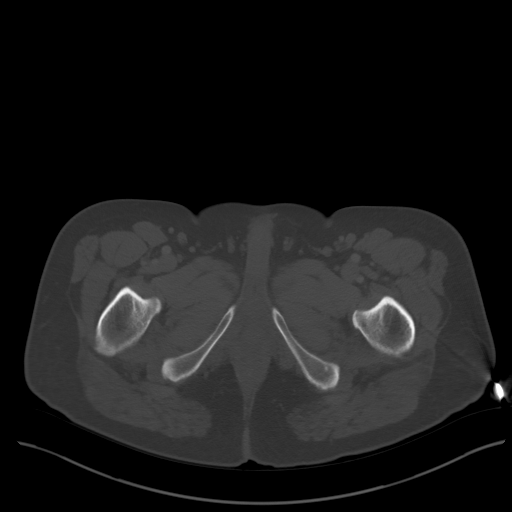
[im 15/110  soft-tissue]
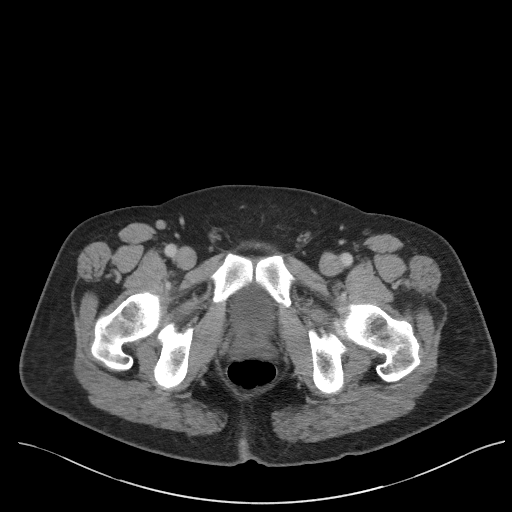
[im 22/110  soft-tissue]
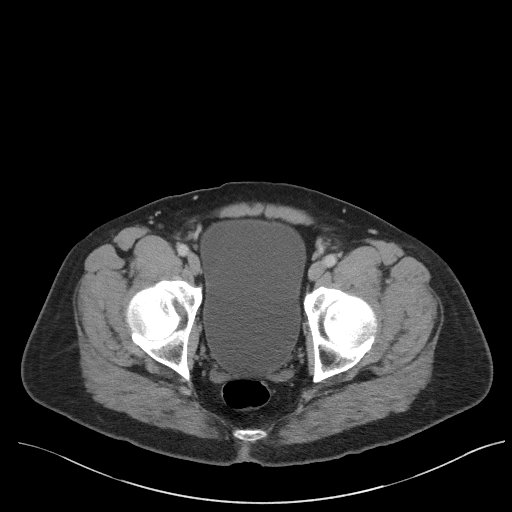
[im 37/110  soft-tissue]
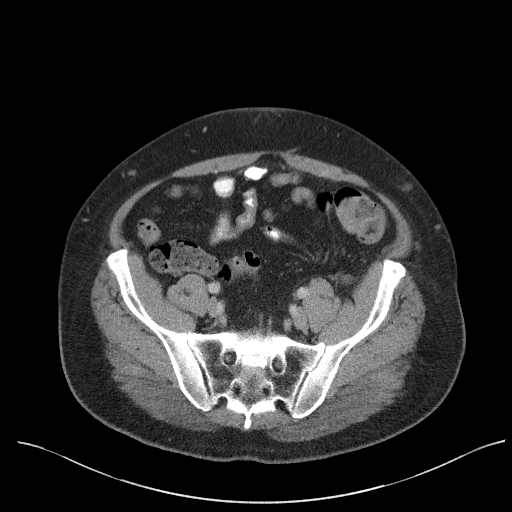
[im 44/110  soft-tissue]
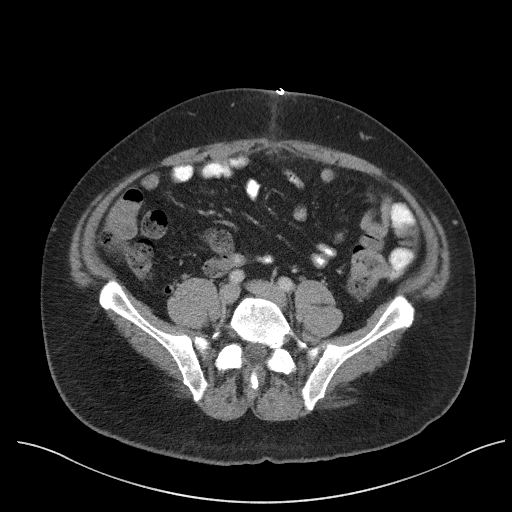
[im 51/110  soft-tissue]
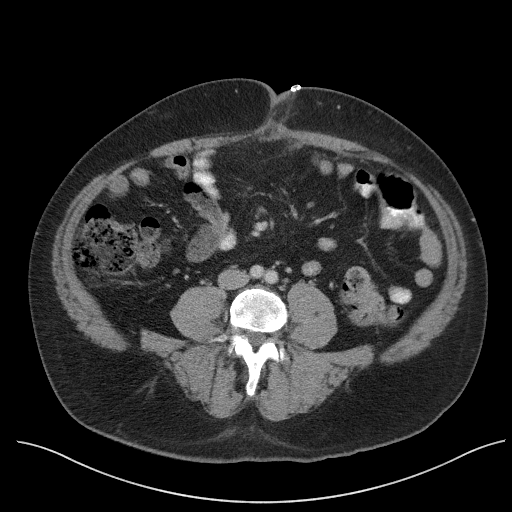
[im 59/110  soft-tissue]
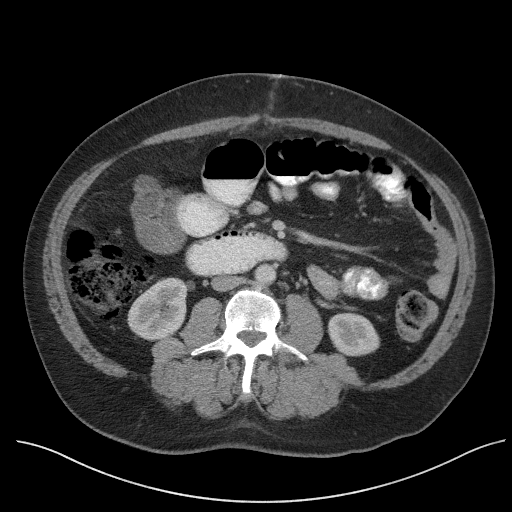
[im 66/110  soft-tissue]
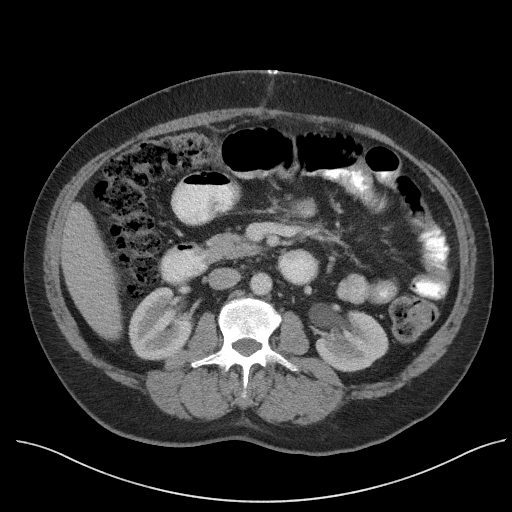
[im 73/110  soft-tissue]
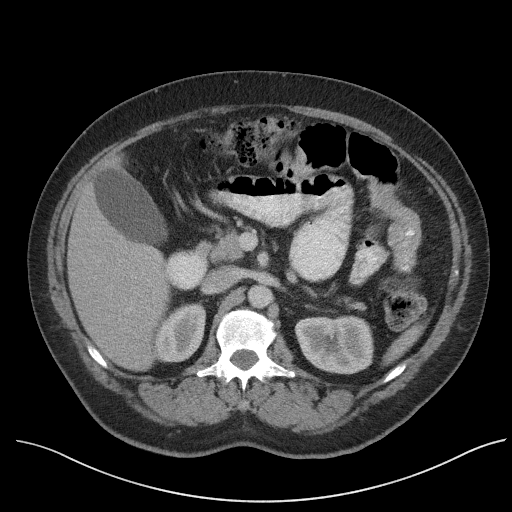
[im 73/110  bone]
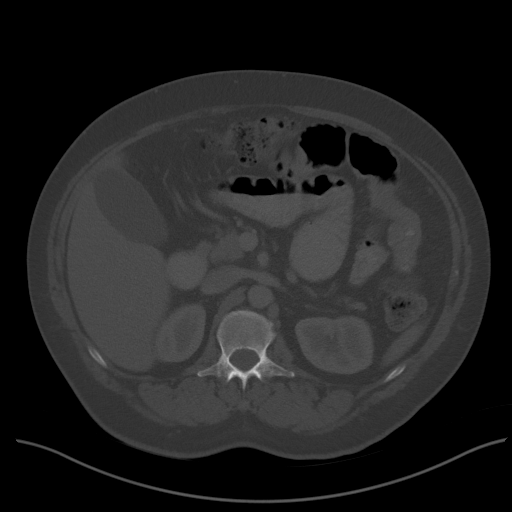
[im 88/110  soft-tissue]
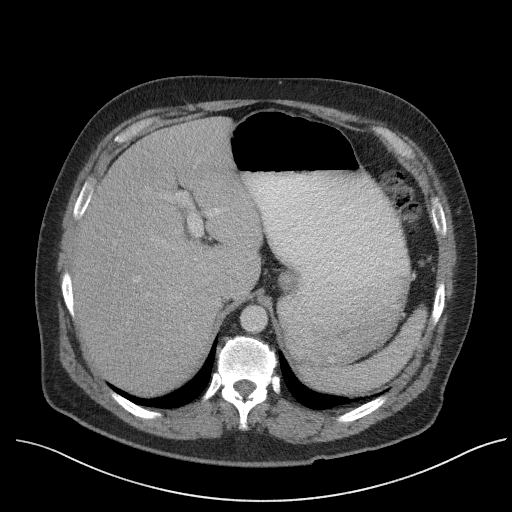
[im 95/110  soft-tissue]
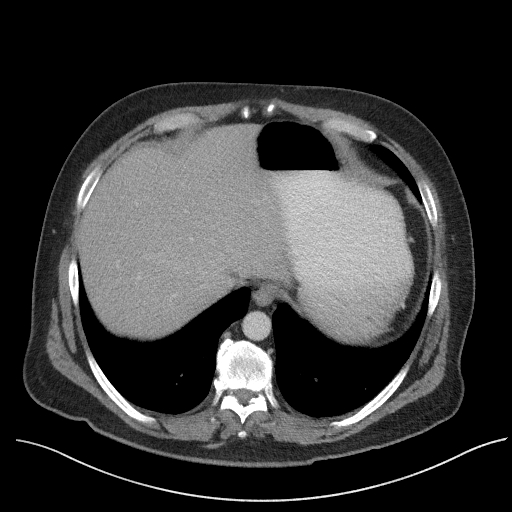
[im 102/110  soft-tissue]
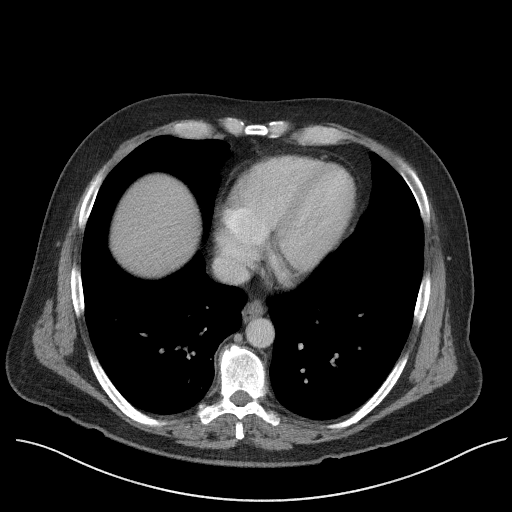

[Series 6: coronal st · coronal · 0.73mm/px · 3 of 104 slices shown]
[im 35/104  soft-tissue]
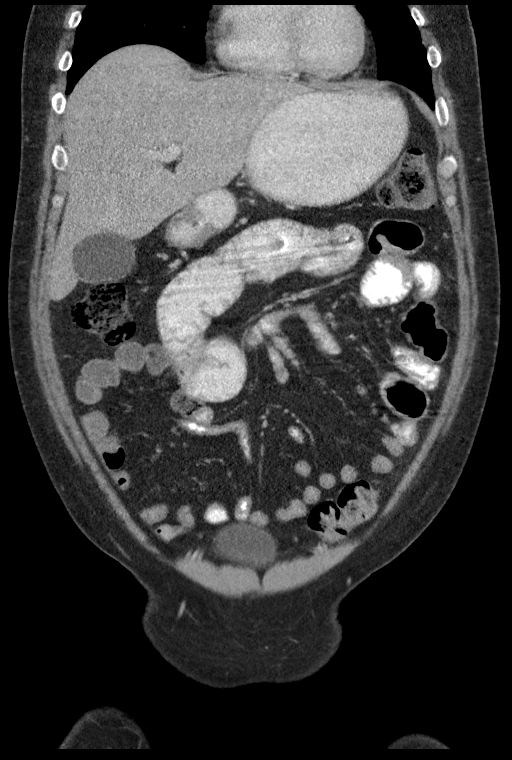
[im 46/104  soft-tissue]
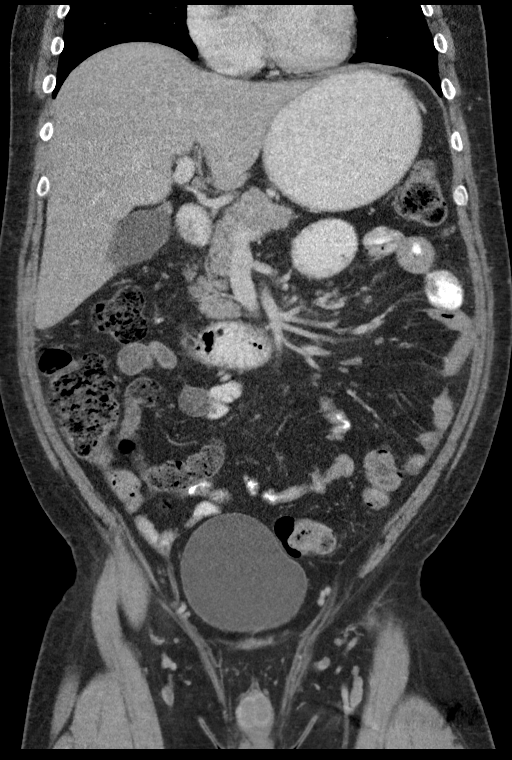
[im 58/104  soft-tissue]
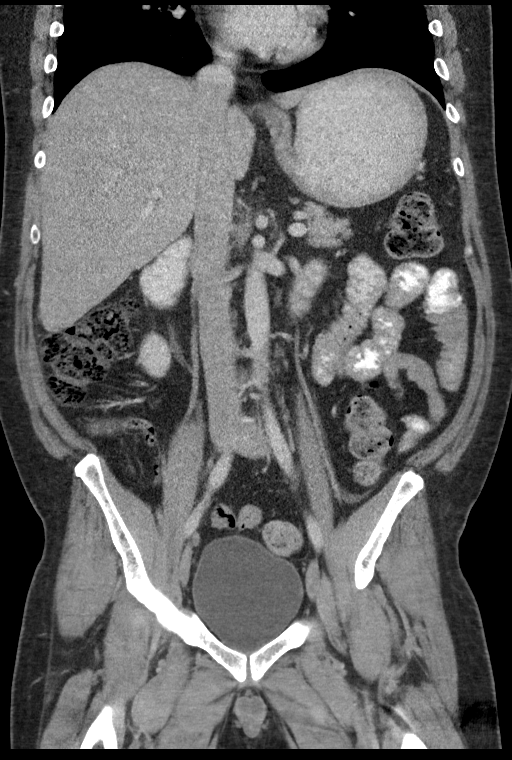

[15 of 46 positions shown; findings below may reference images not displayed]

FINDINGS: Lower chest: The visualized lung bases are clear.

No intra-abdominal free air or free fluid.

Hepatobiliary: A 1 cm stable appearing left hepatic hypodense
lesion, incompletely characterized, possibly a cyst or hemangioma.
The liver is otherwise unremarkable. No intrahepatic biliary ductal
dilatation. The gallbladder is unremarkable.

Pancreas: Unremarkable. No pancreatic ductal dilatation or
surrounding inflammatory changes.

Spleen: Normal in size without focal abnormality.

Adrenals/Urinary Tract: Adrenal glands are unremarkable. Kidneys are
normal, without renal calculi, focal lesion, or hydronephrosis.
Bladder is unremarkable.

Stomach/Bowel: There is postsurgical changes of Nissen
fundoplication. The stomach is mildly distended. Oral contrast.
There are multiple mildly thickened loops of jejunal folds in the
left upper abdomen concerning for enteritis. A mildly dilated loop
of small bowel in the upper abdomen measures up to 4 cm in diameter.
The distal small bowel are decompressed. Contrast however opacified
is this decompressed distal loops of small bowel. Findings most
likely represent postoperative ileus. An early or partial
small-bowel obstruction is less likely but not entirely excluded.
Clinical correlation and follow-up recommended. Large amount of
stool noted throughout the colon. There is fecalization of loops of
distal and terminal ileum. The appendix appears unremarkable.

Vascular/Lymphatic: No significant vascular findings are present. No
enlarged abdominal or pelvic lymph nodes.

Reproductive: The prostate and seminal vesicles are grossly
unremarkable.

Other: Postsurgical changes of anterior abdominal wall with midline
vertical incisional scar and cutaneous staples line. Small fat
containing umbilical hernia. No fluid collection or hematoma.

Musculoskeletal: No acute or significant osseous findings.
IMPRESSION: Findings concerning for enteritis with reactive or postoperative
ileus. A partial or early small-bowel obstruction is less likely but
not entirely excluded. Correlation with clinical exam and follow-up
recommended.

Constipation.

Normal appendix.

## 2019-07-21 DIAGNOSIS — M79671 Pain in right foot: Secondary | ICD-10-CM | POA: Diagnosis not present

## 2019-07-21 DIAGNOSIS — M722 Plantar fascial fibromatosis: Secondary | ICD-10-CM | POA: Diagnosis not present

## 2019-08-21 DIAGNOSIS — Z20822 Contact with and (suspected) exposure to covid-19: Secondary | ICD-10-CM | POA: Diagnosis not present

## 2019-08-26 ENCOUNTER — Ambulatory Visit: Payer: BLUE CROSS/BLUE SHIELD | Admitting: Adult Health

## 2019-09-23 ENCOUNTER — Ambulatory Visit: Payer: BLUE CROSS/BLUE SHIELD | Admitting: Adult Health

## 2019-10-20 ENCOUNTER — Ambulatory Visit: Payer: Self-pay | Admitting: Adult Health

## 2019-10-26 ENCOUNTER — Ambulatory Visit: Payer: Self-pay | Admitting: Adult Health

## 2019-11-12 ENCOUNTER — Ambulatory Visit: Payer: Self-pay | Admitting: Adult Health

## 2019-11-30 ENCOUNTER — Encounter: Payer: Self-pay | Admitting: Adult Health

## 2019-11-30 ENCOUNTER — Ambulatory Visit (INDEPENDENT_AMBULATORY_CARE_PROVIDER_SITE_OTHER): Payer: BC Managed Care – PPO | Admitting: Adult Health

## 2019-11-30 ENCOUNTER — Other Ambulatory Visit: Payer: Self-pay

## 2019-11-30 VITALS — BP 117/84 | HR 69 | Temp 98.2°F | Resp 16 | Ht 70.5 in | Wt 204.2 lb

## 2019-11-30 DIAGNOSIS — K219 Gastro-esophageal reflux disease without esophagitis: Secondary | ICD-10-CM

## 2019-11-30 DIAGNOSIS — Z1389 Encounter for screening for other disorder: Secondary | ICD-10-CM | POA: Diagnosis not present

## 2019-11-30 DIAGNOSIS — F419 Anxiety disorder, unspecified: Secondary | ICD-10-CM

## 2019-11-30 DIAGNOSIS — Z0000 Encounter for general adult medical examination without abnormal findings: Secondary | ICD-10-CM

## 2019-11-30 DIAGNOSIS — Z8719 Personal history of other diseases of the digestive system: Secondary | ICD-10-CM

## 2019-11-30 DIAGNOSIS — R5383 Other fatigue: Secondary | ICD-10-CM

## 2019-11-30 MED ORDER — ESCITALOPRAM OXALATE 10 MG PO TABS: 10.0000 mg | ORAL_TABLET | Freq: Every day | ORAL | 0 refills | Status: DC

## 2019-11-30 NOTE — Progress Notes (Signed)
New patient visit   Patient: Nathaniel Hess   DOB: 1970-05-08   49 y.o. Male  MRN: [id number] Visit Date: 11/30/2019  Today's healthcare provider: Jairo Ben, FNP   Chief Complaint  Patient presents with   New Patient (Initial Visit)   Subjective     HPI  Nathaniel Hess is a 49 y.o. male who presents today as a new patient to establish care. Patient presents to establish care, he states that his previous PCP was Dr. Derrill Kay.Patient is accompanied by his wife who would like to address concerns of constipation. Patients wife reports that patient has issues with irregularity and reports that patient has had surgery in past to correct bowel blockage, wife reports that patient battles with constipation and would like for him to be referred to Dr. Ashok Pall.I. Patient does not follow a well balanced diet, is not actively exercising and reports that sleep patterns are poor.   barrets esophagus over due for endoscopy he reports as well as never had a colonoscopy in past. Denies any oral or rectal symptoms. And needs an appointment with Dr. Allegra Lai- his wife sees her and they would like to see the same gastroenterologist.   Denies any oral bleeding or rectal bleeding.   He scratched his skin and picks at skin. Wants to try something for anxiety. Denies any homicidal suicidal thoughts or intents.   Denies any other concerns at this time.   Patient  denies any fever, body aches,chills, rash, chest pain, shortness of breath, nausea, vomiting, or diarrhea.  Denies dizziness, lightheadedness, pre syncopal or syncopal episodes.   Past Medical History:  Diagnosis Date   Anxiety    Barrett esophagus    Barrett's esophagus    Bowel obstruction (HCC)    Depression    GERD (gastroesophageal reflux disease)    Past Surgical History:  Procedure Laterality Date   BOWEL RESECTION N/A 11/08/2015   Procedure: SMALL BOWEL RESECTION Possible;  Surgeon: Lattie Haw, MD;   Location: [facility];  Service: General;  Laterality: N/A;   ESOPHAGUS SURGERY     LAPAROTOMY N/A 11/08/2015   Procedure: EXPLORATORY LAPAROTOMY;  Surgeon: Lattie Haw, MD;  Location: [facility];  Service: General;  Laterality: N/A;   NISSEN FUNDOPLICATION     Family Status  Relation Name Status   Mother  Deceased   Father  Deceased   Sister  Alive       same father 5 half sister   MGM  Deceased   MGF  Deceased   PGM  Deceased   PGF  Deceased   Neg Hx  (Not Specified)   Family History  Problem Relation Age of Onset   Depression Mother    Multiple sclerosis Mother    COPD Father    Drug abuse Father    Heart disease Father    Colon polyps Father    Diabetes Sister    Diabetes Maternal Grandmother    Stroke Maternal Grandmother    Stroke Maternal Grandfather    Heart attack Paternal Grandfather    Prostate cancer Neg Hx    Colon cancer Neg Hx    Social History   Socioeconomic History   Marital status: Married    Spouse name: Not on file   Number of children: Not on file   Years of education: Not on file   Highest education level: Not on file  Occupational History   Not on file  Tobacco Use   Smoking  status: Never Smoker   Smokeless tobacco: Never Used  Vaping Use   Vaping Use: Never used  Substance and Sexual Activity   Alcohol use: Yes    Alcohol/week: 2.0 standard drinks    Types: 2 Cans of beer per week    Comment: occasional   Drug use: Yes    Frequency: 3.0 times per week    Types: Marijuana   Sexual activity: Yes  Other Topics Concern   Not on file  Social History Narrative   Not on file   Social Determinants of Health   Financial Resource Strain:    Difficulty of Paying Living Expenses: Not on file  Food Insecurity:    Worried About Running Out of Food in the Last Year: Not on file   [employer] Out of Food in the Last Year: Not on file  Transportation Needs:    Lack of Transportation (Medical): Not on file     Lack of Transportation (Non-Medical): Not on file  Physical Activity:    Days of Exercise per Week: Not on file   Minutes of Exercise per Session: Not on file  Stress:    Feeling of Stress : Not on file  Social Connections:    Frequency of Communication with Friends and Family: Not on file   Frequency of Social Gatherings with Friends and Family: Not on file   Attends Religious Services: Not on file   Active Member of Clubs or Organizations: Not on file   Attends BankerClub or Organization Meetings: Not on file   Marital Status: Not on file   Outpatient Medications Prior to Visit  Medication Sig   [DISCONTINUED] busPIRone (BUSPAR) 10 MG tablet Take 2 tablets (20 mg total) by mouth 2 (two) times daily.   [DISCONTINUED] hydrALAZINE (APRESOLINE) 10 MG tablet Take 10 mg by mouth at bedtime as needed.   [DISCONTINUED] polyethylene glycol (MIRALAX / GLYCOLAX) packet Take 17 g by mouth daily.   [DISCONTINUED] sertraline (ZOLOFT) 100 MG tablet Take 1 tablet (100 mg total) by mouth daily. NEED APPOINTMENT   [DISCONTINUED] traZODone (DESYREL) 50 MG tablet START 1 TABLET BY MOUTH NIGHTLY. CAN INCREASE TO 2 OR 3 TABS A NIGHT EVERY 2-3 WEEKS IF NEEDED.   No facility-administered medications prior to visit.   No Known Allergies   There is no immunization history on file for this patient.  Health Maintenance  Topic Date Due   Hepatitis C Screening  Never done   COVID-19 Vaccine (1) Never done   HIV Screening  Never done   TETANUS/TDAP  Never done   INFLUENZA VACCINE  Never done    Patient Care Team: Berniece PapFlinchum, Balian Schaller S, FNP as PCP - General (Family Medicine)  Review of Systems  Gastrointestinal: Positive for constipation.  Psychiatric/Behavioral: The patient is nervous/anxious.   All other systems reviewed and are negative.   Last CBC Lab Results  Component Value Date   WBC 8.0 11/22/2015   HGB 14.0 11/22/2015   HCT 39.1 (L) 11/22/2015   MCV 85.8 11/22/2015    MCH 30.7 11/22/2015   RDW 13.3 11/22/2015   PLT 434 11/22/2015   Last metabolic panel Lab Results  Component Value Date   GLUCOSE 198 (H) 11/22/2015   NA 137 11/22/2015   K 3.2 (L) 11/22/2015   CL 107 11/22/2015   CO2 24 11/22/2015   BUN 5 (L) 11/22/2015   CREATININE 0.93 11/22/2015   GFRNONAA >60 11/22/2015   GFRAA >60 11/22/2015   CALCIUM 8.5 (L) 11/22/2015  PROT 6.5 11/22/2015   ALBUMIN 3.5 11/22/2015   BILITOT 0.4 11/22/2015   ALKPHOS 97 11/22/2015   AST 33 11/22/2015   ALT 56 11/22/2015   ANIONGAP 6 11/22/2015   Last lipids No results found for: CHOL, HDL, LDLCALC, LDLDIRECT, TRIG, CHOLHDL Last hemoglobin A1c No results found for: HGBA1C Last thyroid functions No results found for: TSH, T3TOTAL, T4TOTAL, THYROIDAB Last vitamin D No results found for: 25OHVITD2, 25OHVITD3, VD25OH Last vitamin B12 and Folate No results found for: VITAMINB12, FOLATE    Objective    BP 117/84    Pulse 69    Temp 98.2 F (36.8 C) (Oral)    Resp 16    Ht 5' 10.5" (1.791 m)    Wt 204 lb 3.2 oz (92.6 kg)    SpO2 99%    BMI 28.89 kg/m  Physical Exam Vitals and nursing note reviewed.  Constitutional:      General: He is not in acute distress.    Appearance: Normal appearance. He is well-developed. He is not ill-appearing, toxic-appearing or diaphoretic.     Comments: Patient is alert and oriented and responsive to questions Engages in eye contact with provider. Speaks in full sentences without any pauses without any shortness of breath or distress.    HENT:     Head: Normocephalic and atraumatic.     Right Ear: Hearing, tympanic membrane, ear canal and external ear normal.     Left Ear: Hearing, tympanic membrane, ear canal and external ear normal.     Nose: Nose normal. No congestion or rhinorrhea.     Mouth/Throat:     Mouth: Mucous membranes are moist.     Pharynx: Uvula midline. No oropharyngeal exudate or posterior oropharyngeal erythema.  Eyes:     General: Lids are normal.  No scleral icterus.       Right eye: No discharge.        Left eye: No discharge.     Extraocular Movements: Extraocular movements intact.     Conjunctiva/sclera: Conjunctivae normal.     Pupils: Pupils are equal, round, and reactive to light.  Neck:     Thyroid: No thyromegaly.     Vascular: Normal carotid pulses. No carotid bruit, hepatojugular reflux or JVD.     Trachea: Trachea and phonation normal. No tracheal tenderness or tracheal deviation.     Meningeal: Brudzinski's sign absent.  Cardiovascular:     Rate and Rhythm: Normal rate and regular rhythm.     Pulses: Normal pulses.     Heart sounds: Normal heart sounds, S1 normal and S2 normal. Heart sounds not distant. No murmur heard.  No friction rub. No gallop.   Pulmonary:     Effort: Pulmonary effort is normal. No accessory muscle usage or respiratory distress.     Breath sounds: Normal breath sounds. No stridor. No wheezing, rhonchi or rales.  Chest:     Chest wall: No tenderness.  Abdominal:     General: Bowel sounds are normal. There is no distension.     Palpations: Abdomen is soft. There is no mass.     Tenderness: There is no abdominal tenderness. There is no right CVA tenderness, left CVA tenderness, guarding or rebound.     Hernia: No hernia is present.  Musculoskeletal:        General: No swelling, tenderness, deformity or signs of injury. Normal range of motion.     Cervical back: Full passive range of motion without pain, normal range of motion and neck  supple.     Right lower leg: No edema.     Left lower leg: No edema.     Comments: Patient moves on and off of exam table and in room without difficulty. Gait is normal in hall and in room. Patient is oriented to person place time and situation. Patient answers questions appropriately and engages in conversation.   Lymphadenopathy:     Head:     Right side of head: No submental, submandibular, tonsillar, preauricular, posterior auricular or occipital adenopathy.       Left side of head: No submental, submandibular, tonsillar, preauricular, posterior auricular or occipital adenopathy.     Cervical: No cervical adenopathy.  Skin:    General: Skin is warm and dry.     Capillary Refill: Capillary refill takes less than 2 seconds.     Coloration: Skin is not jaundiced or pale.     Findings: No bruising, erythema, lesion or rash.     Nails: There is no clubbing.     Comments: Sun exposed skin head and arms visualized   Neurological:     General: No focal deficit present.     Mental Status: He is alert and oriented to person, place, and time.     GCS: GCS eye subscore is 4. GCS verbal subscore is 5. GCS motor subscore is 6.     Cranial Nerves: No cranial nerve deficit.     Sensory: No sensory deficit.     Motor: No weakness or abnormal muscle tone.     Coordination: Coordination normal.     Gait: Gait normal.     Deep Tendon Reflexes: Reflexes are normal and symmetric. Reflexes normal.  Psychiatric:        Mood and Affect: Mood normal.        Speech: Speech normal.        Behavior: Behavior normal. Behavior is cooperative.        Thought Content: Thought content normal.        Judgment: Judgment normal.     Depression Screen PHQ 2/9 Scores 11/30/2019 11/30/2019 09/24/2016  PHQ - 2 Score 0 0 6  PHQ- 9 Score 1 - 20   No results found for any visits on 11/30/19.  Assessment & Plan      Routine health maintenance  Screening for blood or protein in urine - Plan: POCT urinalysis dipstick  Fatigue, unspecified type - Plan: CBC with Differential/Platelet, Comprehensive metabolic panel, Lipid panel, TSH, PSA, Ambulatory referral to Gastroenterology, VITAMIN D 25 Hydroxy (Vit-D Deficiency, Fractures)  Anxiety  Gastroesophageal reflux disease, unspecified whether esophagitis present   Orders Placed This Encounter  Procedures   CBC with Differential/Platelet   Comprehensive metabolic panel   Lipid panel   TSH   PSA   VITAMIN D 25  Hydroxy (Vit-D Deficiency, Fractures)   Ambulatory referral to Gastroenterology    Referral Priority:   Routine    Referral Type:   Consultation    Referral Reason:   Specialty Services Required    Referred to Provider:   Toney Reil, MD    Number of Visits Requested:   1   POCT urinalysis dipstick  labs today, follow up on anxiety medication in one month.  Discussed known black box warning for anti depression/ anxiety medication. Need to report any behavioral changes right, if any homicidal or suicidal thoughts or ideas seek medical attention right away. Call 911.  Will need testicular exam and prostate exam at next visit if he  would like. Deferred today. Also needs POCT urine at next visit.   Meds ordered this encounter  Medications   escitalopram (LEXAPRO) 10 MG tablet    Sig: Take 1 tablet (10 mg total) by mouth daily.    Dispense:  90 tablet    Refill:  0    Red Flags discussed. The patient was given clear instructions to go to ER or return to medical center if any red flags develop, symptoms do not improve, worsen or new problems develop. They verbalized understanding.  Return in about 1 month (around 12/31/2019), or if symptoms worsen or fail to improve, for at any time for any worsening symptoms, Go to Emergency room/ urgent care if worse.      Jairo Ben, FNP  [facility] [phone] (phone) [phone] (fax)  [facility]

## 2019-11-30 NOTE — Patient Instructions (Signed)
Generalized Anxiety Disorder, Adult Generalized anxiety disorder (GAD) is a mental health disorder. People with this condition constantly worry about everyday events. Unlike normal anxiety, worry related to GAD is not triggered by a specific event. These worries also do not fade or get better with time. GAD interferes with life functions, including relationships, work, and school. GAD can vary from mild to severe. People with severe GAD can have intense waves of anxiety with physical symptoms (panic attacks). What are the causes? The exact cause of GAD is not known. What increases the risk? This condition is more likely to develop in:  Women.  People who have a family history of anxiety disorders.  People who are very shy.  People who experience very stressful life events, such as the death of a loved one.  People who have a very stressful family environment. What are the signs or symptoms? People with GAD often worry excessively about many things in their lives, such as their health and family. They may also be overly concerned about:  Doing well at work.  Being on time.  Natural disasters.  Friendships. Physical symptoms of GAD include:  Fatigue.  Muscle tension or having muscle twitches.  Trembling or feeling shaky.  Being easily startled.  Feeling like your heart is pounding or racing.  Feeling out of breath or like you cannot take a deep breath.  Having trouble falling asleep or staying asleep.  Sweating.  Nausea, diarrhea, or irritable bowel syndrome (IBS).  Headaches.  Trouble concentrating or remembering facts.  Restlessness.  Irritability. How is this diagnosed? Your health care provider can diagnose GAD based on your symptoms and medical history. You will also have a physical exam. The health care provider will ask specific questions about your symptoms, including how severe they are, when they started, and if they come and go. Your health care  provider may ask you about your use of alcohol or drugs, including prescription medicines. Your health care provider may refer you to a mental health specialist for further evaluation. Your health care provider will do a thorough examination and may perform additional tests to rule out other possible causes of your symptoms. To be diagnosed with GAD, a person must have anxiety that:  Is out of his or her control.  Affects several different aspects of his or her life, such as work and relationships.  Causes distress that makes him or her unable to take part in normal activities.  Includes at least three physical symptoms of GAD, such as restlessness, fatigue, trouble concentrating, irritability, muscle tension, or sleep problems. Before your health care provider can confirm a diagnosis of GAD, these symptoms must be present more days than they are not, and they must last for six months or longer. How is this treated? The following therapies are usually used to treat GAD:  Medicine. Antidepressant medicine is usually prescribed for long-term daily control. Antianxiety medicines may be added in severe cases, especially when panic attacks occur.  Talk therapy (psychotherapy). Certain types of talk therapy can be helpful in treating GAD by providing support, education, and guidance. Options include: ? Cognitive behavioral therapy (CBT). People learn coping skills and techniques to ease their anxiety. They learn to identify unrealistic or negative thoughts and behaviors and to replace them with positive ones. ? Acceptance and commitment therapy (ACT). This treatment teaches people how to be mindful as a way to cope with unwanted thoughts and feelings. ? Biofeedback. This process trains you to manage your body's response (  physiological response) through breathing techniques and relaxation methods. You will work with a therapist while machines are used to monitor your physical symptoms.  Stress  management techniques. These include yoga, meditation, and exercise. A mental health specialist can help determine which treatment is best for you. Some people see improvement with one type of therapy. However, other people require a combination of therapies. Follow these instructions at home:  Take over-the-counter and prescription medicines only as told by your health care provider.  Try to maintain a normal routine.  Try to anticipate stressful situations and allow extra time to manage them.  Practice any stress management or self-calming techniques as taught by your health care provider.  Do not punish yourself for setbacks or for not making progress.  Try to recognize your accomplishments, even if they are small.  Keep all follow-up visits as told by your health care provider. This is important. Contact a health care provider if:  Your symptoms do not get better.  Your symptoms get worse.  You have signs of depression, such as: ? A persistently sad, cranky, or irritable mood. ? Loss of enjoyment in activities that used to bring you joy. ? Change in weight or eating. ? Changes in sleeping habits. ? Avoiding friends or family members. ? Loss of energy for normal tasks. ? Feelings of guilt or worthlessness. Get help right away if:  You have serious thoughts about hurting yourself or others. If you ever feel like you may hurt yourself or others, or have thoughts about taking your own life, get help right away. You can go to your nearest emergency department or call:  Your local emergency services (911 in the [place]  A suicide crisis helpline, such as the National Suicide Prevention Lifeline at 1-800-273-8255. This is open 24 hours a day. Summary  Generalized anxiety disorder (GAD) is a mental health disorder that involves worry that is not triggered by a specific event.  People with GAD often worry excessively about many things in their lives, such as their health and  family.  GAD may cause physical symptoms such as restlessness, trouble concentrating, sleep problems, frequent sweating, nausea, diarrhea, headaches, and trembling or muscle twitching.  A mental health specialist can help determine which treatment is best for you. Some people see improvement with one type of therapy. However, other people require a combination of therapies. This information is not intended to replace advice given to you by your health care provider. Make sure you discuss any questions you have with your health care provider. Document Revised: 01/24/2017 Document Reviewed: 01/02/2016 Elsevier Patient Education  2020 [employer] Escitalopram tablets What is this medicine? ESCITALOPRAM (es sye TAL oh pram) is used to treat depression and certain types of anxiety. This medicine may be used for other purposes; ask your health care provider or pharmacist if you have questions. COMMON BRAND NAME(S): Lexapro What should I tell my health care provider before I take this medicine? They need to know if you have any of these conditions:  bipolar disorder or a family history of bipolar disorder  diabetes  glaucoma  heart disease  kidney or liver disease  receiving electroconvulsive therapy  seizures (convulsions)  suicidal thoughts, plans, or attempt by you or a family member  an unusual or allergic reaction to escitalopram, the related drug citalopram, other medicines, foods, dyes, or preservatives  pregnant or trying to become pregnant  breast-feeding How should I use this medicine? Take this medicine by mouth with a glass of   water. Follow the directions on the prescription label. You can take it with or without food. If it upsets your stomach, take it with food. Take your medicine at regular intervals. Do not take it more often than directed. Do not stop taking this medicine suddenly except upon the advice of your doctor. Stopping this medicine too quickly may cause  serious side effects or your condition may worsen. A special MedGuide will be given to you by the pharmacist with each prescription and refill. Be sure to read this information carefully each time. Talk to your pediatrician regarding the use of this medicine in children. Special care may be needed. Overdosage: If you think you have taken too much of this medicine contact a poison control center or emergency room at once. NOTE: This medicine is only for you. Do not share this medicine with others. What if I miss a dose? If you miss a dose, take it as soon as you can. If it is almost time for your next dose, take only that dose. Do not take double or extra doses. What may interact with this medicine? Do not take this medicine with any of the following medications:  certain medicines for fungal infections like fluconazole, itraconazole, ketoconazole, posaconazole, voriconazole  cisapride  citalopram  dronedarone  linezolid  MAOIs like Carbex, Eldepryl, Marplan, Nardil, and Parnate  methylene blue (injected into a vein)  pimozide  thioridazine This medicine may also interact with the following medications:  alcohol  amphetamines  aspirin and aspirin-like medicines  carbamazepine  certain medicines for depression, anxiety, or psychotic disturbances  certain medicines for migraine headache like almotriptan, eletriptan, frovatriptan, naratriptan, rizatriptan, sumatriptan, zolmitriptan  certain medicines for sleep  certain medicines that treat or prevent blood clots like warfarin, enoxaparin, dalteparin  cimetidine  diuretics  dofetilide  fentanyl  furazolidone  isoniazid  lithium  metoprolol  NSAIDs, medicines for pain and inflammation, like ibuprofen or naproxen  other medicines that prolong the QT interval (cause an abnormal heart rhythm)  procarbazine  rasagiline  supplements like St. John's wort, kava kava,  valerian  tramadol  tryptophan  ziprasidone This list may not describe all possible interactions. Give your health care provider a list of all the medicines, herbs, non-prescription drugs, or dietary supplements you use. Also tell them if you smoke, drink alcohol, or use illegal drugs. Some items may interact with your medicine. What should I watch for while using this medicine? Tell your doctor if your symptoms do not get better or if they get worse. Visit your doctor or health care professional for regular checks on your progress. Because it may take several weeks to see the full effects of this medicine, it is important to continue your treatment as prescribed by your doctor. Patients and their families should watch out for new or worsening thoughts of suicide or depression. Also watch out for sudden changes in feelings such as feeling anxious, agitated, panicky, irritable, hostile, aggressive, impulsive, severely restless, overly excited and hyperactive, or not being able to sleep. If this happens, especially at the beginning of treatment or after a change in dose, call your health care professional. Bonita Quin may get drowsy or dizzy. Do not drive, use machinery, or do anything that needs mental alertness until you know how this medicine affects you. Do not stand or sit up quickly, especially if you are an older patient. This reduces the risk of dizzy or fainting spells. Alcohol may interfere with the effect of this medicine. Avoid alcoholic drinks. Your  mouth may get dry. Chewing sugarless gum or sucking hard candy, and drinking plenty of water may help. Contact your doctor if the problem does not go away or is severe. What side effects may I notice from receiving this medicine? Side effects that you should report to your doctor or health care professional as soon as possible:  allergic reactions like skin rash, itching or hives, swelling of the face, lips, or tongue  anxious  black, tarry  stools  changes in vision  confusion  elevated mood, decreased need for sleep, racing thoughts, impulsive behavior  eye pain  fast, irregular heartbeat  feeling faint or lightheaded, falls  feeling agitated, angry, or irritable  hallucination, loss of contact with reality  loss of balance or coordination  loss of memory  painful or prolonged erections  restlessness, pacing, inability to keep still  seizures  stiff muscles  suicidal thoughts or other mood changes  trouble sleeping  unusual bleeding or bruising  unusually weak or tired  vomiting Side effects that usually do not require medical attention (report to your doctor or health care professional if they continue or are bothersome):  changes in appetite  change in sex drive or performance  headache  increased sweating  indigestion, nausea  tremors This list may not describe all possible side effects. Call your doctor for medical advice about side effects. You may report side effects to FDA at 1-800-FDA-1088. Where should I keep my medicine? Keep out of reach of children. Store at room temperature between 15 and 30 degrees C (59 and 86 degrees F). Throw away any unused medicine after the expiration date. NOTE: This sheet is a summary. It may not cover all possible information. If you have questions about this medicine, talk to your doctor, pharmacist, or health care provider.  2020 Elsevier/Gold Standard (2018-02-02 11:21:44)  

## 2019-12-01 DIAGNOSIS — R5383 Other fatigue: Secondary | ICD-10-CM | POA: Diagnosis not present

## 2019-12-02 LAB — CBC WITH DIFFERENTIAL/PLATELET
Basophils Absolute: 0.1 10*3/uL (ref 0.0–0.2)
Basos: 1 %
EOS (ABSOLUTE): 0.2 10*3/uL (ref 0.0–0.4)
Eos: 3 %
Hematocrit: 44.3 % (ref 37.5–51.0)
Hemoglobin: 15.1 g/dL (ref 13.0–17.7)
Immature Grans (Abs): 0 10*3/uL (ref 0.0–0.1)
Immature Granulocytes: 0 %
Lymphocytes Absolute: 1.6 10*3/uL (ref 0.7–3.1)
Lymphs: 25 %
MCH: 30.4 pg (ref 26.6–33.0)
MCHC: 34.1 g/dL (ref 31.5–35.7)
MCV: 89 fL (ref 79–97)
Monocytes Absolute: 0.6 10*3/uL (ref 0.1–0.9)
Monocytes: 9 %
Neutrophils Absolute: 3.9 10*3/uL (ref 1.4–7.0)
Neutrophils: 62 %
Platelets: 273 10*3/uL (ref 150–450)
RBC: 4.96 x10E6/uL (ref 4.14–5.80)
RDW: 13.7 % (ref 11.6–15.4)
WBC: 6.4 10*3/uL (ref 3.4–10.8)

## 2019-12-02 LAB — COMPREHENSIVE METABOLIC PANEL
ALT: 15 IU/L (ref 0–44)
AST: 21 IU/L (ref 0–40)
Albumin/Globulin Ratio: 2.3 — ABNORMAL HIGH (ref 1.2–2.2)
Albumin: 3.7 g/dL — ABNORMAL LOW (ref 4.0–5.0)
Alkaline Phosphatase: 51 IU/L (ref 44–121)
BUN/Creatinine Ratio: 10 (ref 9–20)
BUN: 8 mg/dL (ref 6–24)
Bilirubin Total: 0.3 mg/dL (ref 0.0–1.2)
CO2: 21 mmol/L (ref 20–29)
Calcium: 8.5 mg/dL — ABNORMAL LOW (ref 8.7–10.2)
Chloride: 106 mmol/L (ref 96–106)
Creatinine, Ser: 0.82 mg/dL (ref 0.76–1.27)
GFR calc Af Amer: 121 mL/min/{1.73_m2} (ref >59)
GFR calc non Af Amer: 105 mL/min/{1.73_m2} (ref >59)
Globulin, Total: 1.6 g/dL (ref 1.5–4.5)
Glucose: 92 mg/dL (ref 65–99)
Potassium: 4.1 mmol/L (ref 3.5–5.2)
Sodium: 140 mmol/L (ref 134–144)
Total Protein: 5.3 g/dL — ABNORMAL LOW (ref 6.0–8.5)

## 2019-12-02 LAB — LIPID PANEL
Chol/HDL Ratio: 3.3 ratio (ref 0.0–5.0)
Cholesterol, Total: 148 mg/dL (ref 100–199)
HDL: 45 mg/dL (ref >39)
LDL Chol Calc (NIH): 85 mg/dL (ref 0–99)
Triglycerides: 97 mg/dL (ref 0–149)
VLDL Cholesterol Cal: 18 mg/dL (ref 5–40)

## 2019-12-02 LAB — VITAMIN D 25 HYDROXY (VIT D DEFICIENCY, FRACTURES): Vit D, 25-Hydroxy: 15.1 ng/mL — ABNORMAL LOW (ref 30.0–100.0)

## 2019-12-02 LAB — TSH: TSH: 1.44 u[IU]/mL (ref 0.450–4.500)

## 2019-12-02 LAB — PSA: Prostate Specific Ag, Serum: 1 ng/mL (ref 0.0–4.0)

## 2019-12-08 ENCOUNTER — Other Ambulatory Visit: Payer: Self-pay | Admitting: Adult Health

## 2019-12-08 DIAGNOSIS — E559 Vitamin D deficiency, unspecified: Secondary | ICD-10-CM

## 2019-12-08 DIAGNOSIS — R770 Abnormality of albumin: Secondary | ICD-10-CM

## 2019-12-08 MED ORDER — VITAMIN D (ERGOCALCIFEROL) 1.25 MG (50000 UNIT) PO CAPS: 50000.0000 [IU] | ORAL_CAPSULE | ORAL | 0 refills | Status: DC

## 2019-12-08 NOTE — Progress Notes (Signed)
CBC within normal limits, no signs of infection or anemia.  CMP shows total protein and albumin mildly low, do recommend he keep Gastrointestinal appointment with Dr. Allegra Lai as well as eat a well balanced diet. Mens daily vitamin.  Cholesterol within normal limits. Continue heart healthy diet and exercise daily.  TSH within normal limits.  PSA for prostate within normal limits.  Vitamin D low, recommend Vitamin D prescription and stopping any over the counter. Will send script for Vitamin D at 50,00 units once every 7 days  ( once weekly by mouth) for 12 weeks. This may improve fatigue. Sent to [facility] already.  Recheck labs Vitamin D and CMP he following week or two after completing, Walk in to lab ok at that time. Need urine dip at next visit as patient was unable to void at last to screen for blood and protein.

## 2019-12-31 ENCOUNTER — Ambulatory Visit (INDEPENDENT_AMBULATORY_CARE_PROVIDER_SITE_OTHER): Payer: BC Managed Care – PPO | Admitting: Adult Health

## 2019-12-31 ENCOUNTER — Other Ambulatory Visit: Payer: Self-pay

## 2019-12-31 VITALS — BP 118/78 | HR 78 | Temp 98.0°F

## 2019-12-31 DIAGNOSIS — H6692 Otitis media, unspecified, left ear: Secondary | ICD-10-CM

## 2019-12-31 DIAGNOSIS — F419 Anxiety disorder, unspecified: Secondary | ICD-10-CM

## 2019-12-31 DIAGNOSIS — H60502 Unspecified acute noninfective otitis externa, left ear: Secondary | ICD-10-CM

## 2019-12-31 DIAGNOSIS — Z1389 Encounter for screening for other disorder: Secondary | ICD-10-CM

## 2019-12-31 LAB — POCT URINALYSIS DIPSTICK
Bilirubin, UA: NEGATIVE
Blood, UA: NEGATIVE
Glucose, UA: NEGATIVE
Ketones, UA: NEGATIVE
Leukocytes, UA: NEGATIVE
Nitrite, UA: NEGATIVE
Protein, UA: NEGATIVE
Spec Grav, UA: 1.01 (ref 1.010–1.025)
Urobilinogen, UA: 0.2 E.U./dL
pH, UA: 5 (ref 5.0–8.0)

## 2019-12-31 MED ORDER — ALPRAZOLAM 0.25 MG PO TABS: 0.2500 mg | ORAL_TABLET | Freq: Every evening | ORAL | 0 refills | Status: DC | PRN

## 2019-12-31 MED ORDER — AMOXICILLIN-POT CLAVULANATE 875-125 MG PO TABS: 1.0000 | ORAL_TABLET | Freq: Two times a day (BID) | ORAL | 0 refills | Status: DC

## 2019-12-31 MED ORDER — BUPROPION HCL ER (XL) 300 MG PO TB24: 300.0000 mg | ORAL_TABLET | Freq: Every day | ORAL | 0 refills | Status: DC

## 2019-12-31 MED ORDER — BENZONATATE 100 MG PO CAPS: 100.0000 mg | ORAL_CAPSULE | Freq: Every evening | ORAL | 0 refills | Status: DC | PRN

## 2019-12-31 MED ORDER — GUAIFENESIN ER 600 MG PO TB12: 600.0000 mg | ORAL_TABLET | Freq: Two times a day (BID) | ORAL | 0 refills | Status: AC

## 2019-12-31 MED ORDER — PREDNISONE 10 MG (21) PO TBPK
ORAL_TABLET | ORAL | 0 refills | Status: DC
Start: 2019-12-31 — End: 2020-01-31

## 2019-12-31 MED ORDER — NEOMYCIN-POLYMYXIN-HC 3.5-10000-1 OT SOLN: 4.0000 [drp] | Freq: Four times a day (QID) | OTIC | 0 refills | Status: AC

## 2019-12-31 NOTE — Progress Notes (Signed)
Negative urine

## 2019-12-31 NOTE — Progress Notes (Signed)
Established patient visit   Patient: Nathaniel Hess   DOB: 09-25-1970   49 y.o. Male  MRN: [id number] Visit Date: 12/31/2019  Today's healthcare provider: Jairo BenMichelle Smith Charly Holcomb, FNP   Chief Complaint  Patient presents with  . Anxiety   Subjective    HPI  Anxiety, Follow-up  He was last seen for anxiety 1 months ago. Changes made at last visit include patient was started on Lexapro 10mg .   He reports fair compliance with treatment. Patient discontinued medication He reports fair tolerance of treatment. He stopped the Lexapro due to side effect of sleepiness.   He is having side effects.  He feels his anxiety is moderate and Unchanged since last visit.  He reports sinus pressure and pain and dome chest congestion and cough for over 2.5 weeks. No fever, no known exposure. No loss of taste or smell.   Symptoms: No chest pain Yes difficulty concentrating  No dizziness Yes fatigue  No feelings of losing control Yes insomnia  Yes irritable No palpitations  Yes panic attacks No racing thoughts  No shortness of breath No sweating  No tremors/shakes    GAD-7 Results GAD-7 Generalized Anxiety Disorder Screening Tool 09/24/2016  1. Feeling Nervous, Anxious, or on Edge 3  2. Not Being Able to Stop or Control Worrying 3  3. Worrying Too Much About Different Things 3  4. Trouble Relaxing 3  5. Being So Restless it's Hard To Sit Still 0  6. Becoming Easily Annoyed or Irritable 3  7. Feeling Afraid As If Something Awful Might Happen 0  Total GAD-7 Score 15  Difficulty At Work, Home, or Getting  Along With Others? Very difficult    PHQ-9 Scores PHQ9 SCORE ONLY 12/31/2019 11/30/2019 11/30/2019  PHQ-9 Total Score 14 1 0    ---------------------------------------------------------------------------------------------------    Medications: Outpatient Medications Prior to Visit  Medication Sig  . Vitamin D, Ergocalciferol, (DRISDOL) 1.25 MG (50000 UNIT) CAPS capsule Take 1  capsule (50,000 Units total) by mouth every 7 (seven) days. (taking one tablet per week)check lab the following 1-2 weeks after completing.  . [DISCONTINUED] escitalopram (LEXAPRO) 10 MG tablet Take 1 tablet (10 mg total) by mouth daily.   No facility-administered medications prior to visit.    Review of Systems  Constitutional: Positive for fatigue. Negative for activity change, appetite change, chills, diaphoresis, fever and unexpected weight change.  Respiratory: Negative.   Cardiovascular: Negative.   Gastrointestinal: Negative.   Genitourinary: Negative.   Musculoskeletal: Negative.   Skin: Negative.   Neurological: Negative.   Hematological: Negative.   Psychiatric/Behavioral: Negative.     Last CBC Lab Results  Component Value Date   WBC 6.4 12/01/2019   HGB 15.1 12/01/2019   HCT 44.3 12/01/2019   MCV 89 12/01/2019   MCH 30.4 12/01/2019   RDW 13.7 12/01/2019   PLT 273 12/01/2019   Last metabolic panel Lab Results  Component Value Date   GLUCOSE 92 12/01/2019   NA 140 12/01/2019   K 4.1 12/01/2019   CL 106 12/01/2019   CO2 21 12/01/2019   BUN 8 12/01/2019   CREATININE 0.82 12/01/2019   GFRNONAA 105 12/01/2019   GFRAA 121 12/01/2019   CALCIUM 8.5 (L) 12/01/2019   PROT 5.3 (L) 12/01/2019   ALBUMIN 3.7 (L) 12/01/2019   LABGLOB 1.6 12/01/2019   AGRATIO 2.3 (H) 12/01/2019   BILITOT 0.3 12/01/2019   ALKPHOS 51 12/01/2019   AST 21 12/01/2019   ALT 15 12/01/2019   ANIONGAP  6 11/22/2015   Last lipids Lab Results  Component Value Date   CHOL 148 12/01/2019   HDL 45 12/01/2019   LDLCALC 85 12/01/2019   TRIG 97 12/01/2019   CHOLHDL 3.3 12/01/2019   Last hemoglobin A1c No results found for: HGBA1C Last thyroid functions Lab Results  Component Value Date   TSH 1.440 12/01/2019   Last vitamin D Lab Results  Component Value Date   VD25OH 15.1 (L) 12/01/2019   Last vitamin B12 and Folate No results found for: VITAMINB12, FOLATE    Objective    BP  118/78   Pulse 78   Temp 98 F (36.7 C)  BP Readings from Last 3 Encounters:  12/31/19 118/78  11/30/19 117/84  09/24/16 113/75   Wt Readings from Last 3 Encounters:  11/30/19 204 lb 3.2 oz (92.6 kg)  09/24/16 205 lb (93 kg)  12/05/15 207 lb (93.9 kg)      Physical Exam Vitals and nursing note reviewed.  Constitutional:      General: He is not in acute distress.    Appearance: Normal appearance. He is well-developed. He is not ill-appearing, toxic-appearing or diaphoretic.     Comments: Patient is alert and oriented and responsive to questions Engages in eye contact with provider. Speaks in full sentences without any pauses without any shortness of breath or distress.    HENT:     Head: Normocephalic and atraumatic.     Right Ear: Hearing, ear canal and external ear normal. No drainage or swelling. A middle ear effusion is present. Tympanic membrane is not erythematous.     Left Ear: Hearing, ear canal and external ear normal. Drainage and swelling present. No laceration. A middle ear effusion is present. Tympanic membrane is erythematous.     Nose:     Right Turbinates: Not enlarged, swollen or pale.     Left Turbinates: Not enlarged, swollen or pale.     Right Sinus: Maxillary sinus tenderness and frontal sinus tenderness present.     Left Sinus: Maxillary sinus tenderness and frontal sinus tenderness present.     Mouth/Throat:     Lips: Pink.     Mouth: Mucous membranes are moist.     Tongue: No lesions.     Pharynx: Oropharynx is clear. Uvula midline. No pharyngeal swelling, oropharyngeal exudate, posterior oropharyngeal erythema or uvula swelling.     Tonsils: No tonsillar exudate or tonsillar abscesses.  Eyes:     General: Lids are normal. No scleral icterus.       Right eye: No discharge.        Left eye: No discharge.     Extraocular Movements: Extraocular movements intact.     Conjunctiva/sclera: Conjunctivae normal.     Pupils: Pupils are equal, round, and  reactive to light.  Neck:     Thyroid: No thyromegaly.     Vascular: Normal carotid pulses. No carotid bruit, hepatojugular reflux or JVD.     Trachea: Trachea and phonation normal. No tracheal tenderness or tracheal deviation.     Meningeal: Brudzinski's sign absent.  Cardiovascular:     Rate and Rhythm: Normal rate and regular rhythm.     Pulses: Normal pulses.     Heart sounds: Normal heart sounds, S1 normal and S2 normal. Heart sounds not distant. No murmur heard.  No friction rub. No gallop.   Pulmonary:     Effort: Pulmonary effort is normal. No accessory muscle usage or respiratory distress.     Breath sounds: Normal  breath sounds. No stridor. No wheezing or rales.  Chest:     Chest wall: No tenderness.  Abdominal:     General: Bowel sounds are normal. There is no distension.     Palpations: Abdomen is soft. There is no mass.     Tenderness: There is no abdominal tenderness. There is no right CVA tenderness, left CVA tenderness, guarding or rebound.     Hernia: No hernia is present.  Musculoskeletal:        General: No swelling, tenderness, deformity or signs of injury. Normal range of motion.     Cervical back: Full passive range of motion without pain, normal range of motion and neck supple.     Right lower leg: No edema.     Left lower leg: No edema.     Comments: Patient moves on and off of exam table and in room without difficulty. Gait is normal in hall and in room. Patient is oriented to person place time and situation. Patient answers questions appropriately and engages in conversation.   Lymphadenopathy:     Head:     Right side of head: No submental, submandibular, tonsillar, preauricular, posterior auricular or occipital adenopathy.     Left side of head: No submental, submandibular, tonsillar, preauricular, posterior auricular or occipital adenopathy.     Cervical: No cervical adenopathy.  Skin:    General: Skin is warm and dry.     Capillary Refill: Capillary  refill takes less than 2 seconds.     Coloration: Skin is not jaundiced or pale.     Findings: No bruising, erythema, lesion or rash.     Nails: There is no clubbing.  Neurological:     General: No focal deficit present.     Mental Status: He is alert and oriented to person, place, and time.     GCS: GCS eye subscore is 4. GCS verbal subscore is 5. GCS motor subscore is 6.     Cranial Nerves: No cranial nerve deficit.     Sensory: No sensory deficit.     Motor: No weakness or abnormal muscle tone.     Coordination: Coordination normal.     Gait: Gait normal.     Deep Tendon Reflexes: Reflexes are normal and symmetric. Reflexes normal.  Psychiatric:        Mood and Affect: Mood normal.        Speech: Speech normal.        Behavior: Behavior normal.        Thought Content: Thought content normal.        Judgment: Judgment normal.      Results for orders placed or performed in visit on 12/31/19  POCT urinalysis dipstick  Result Value Ref Range   Color, UA yellow    Clarity, UA clear    Glucose, UA Negative Negative   Bilirubin, UA negative    Ketones, UA negative    Spec Grav, UA 1.010 1.010 - 1.025   Blood, UA negative    pH, UA 5.0 5.0 - 8.0   Protein, UA Negative Negative   Urobilinogen, UA 0.2 0.2 or 1.0 E.U./dL   Nitrite, UA negative    Leukocytes, UA Negative Negative   Appearance     Odor      Assessment & Plan     Anxiety - Plan: buPROPion (WELLBUTRIN XL) 300 MG 24 hr tablet, ALPRAZolam (XANAX) 0.25 MG tablet, Ambulatory referral to Psychiatry  Screening for blood or protein in urine -  Plan: POCT urinalysis dipstick  Left otitis media, unspecified otitis media type  Acute otitis externa of left ear, unspecified type   Meds ordered this encounter  Medications  . buPROPion (WELLBUTRIN XL) 300 MG 24 hr tablet    Sig: Take 1 tablet (300 mg total) by mouth daily. Take half tablet for first 7 days then increase to 1 tablet daily.    Dispense:  90 tablet     Refill:  0  . ALPRAZolam (XANAX) 0.25 MG tablet    Sig: Take 1 tablet (0.25 mg total) by mouth at bedtime as needed for anxiety or sleep. Do not use every day    Dispense:  30 tablet    Refill:  0  . amoxicillin-clavulanate (AUGMENTIN) 875-125 MG tablet    Sig: Take 1 tablet by mouth 2 (two) times daily.    Dispense:  20 tablet    Refill:  0  . predniSONE (STERAPRED UNI-PAK 21 TAB) 10 MG (21) TBPK tablet    Sig: PO: Take 6 tablets on day 1:Take 5 tablets day 2:Take 4 tablets day 3: Take 3 tablets day 4:Take 2 tablets day five: 5 Take 1 tablet day 6    Dispense:  21 tablet    Refill:  0  . neomycin-polymyxin-hydrocortisone (CORTISPORIN) OTIC solution    Sig: Place 4 drops into the left ear 4 (four) times daily for 10 days.    Dispense:  8 mL    Refill:  0  . guaiFENesin (MUCINEX) 600 MG 12 hr tablet    Sig: Take 1 tablet (600 mg total) by mouth 2 (two) times daily for 10 days.    Dispense:  20 tablet    Refill:  0  . benzonatate (TESSALON) 100 MG capsule    Sig: Take 1 capsule (100 mg total) by mouth at bedtime as needed for cough.    Dispense:  20 capsule    Refill:  0  understand that xanax only PRN and only until Wellbutrin becomes effective.   He has sinusitis, some chest congestion, advise testing for covid, no loss of taste or smell.   referral to [facility] psychiatry Orders Placed This Encounter  Procedures  . Ambulatory referral to Psychiatry  . POCT urinalysis dipstick   Return in about 1 month (around 01/30/2020), or if symptoms worsen or fail to improve, for at any time for any worsening symptoms, Go to Emergency room/ urgent care if worse.      Red Flags discussed. The patient was given clear instructions to go to ER or return to medical center if any red flags develop, symptoms do not improve, worsen or new problems develop. They verbalized understanding.   Addressed acute and or chronic medical problems today requiring 45 minutes reviewing patients medical  record,labs, counseling patient regarding patient's conditions, any medications, answering questions regarding health, and coordination of care as needed. After visit summary patient given copy and reviewed.  Jairo Ben, FNP  [facility] [phone] (phone) [phone] (fax)  [facility]

## 2019-12-31 NOTE — Patient Instructions (Signed)
Bupropion extended-release tablets (Depression/Mood Disorders) °What is this medicine? °BUPROPION (byoo PROE pee on) is used to treat depression. °This medicine may be used for other purposes; ask your health care provider or pharmacist if you have questions. °COMMON BRAND NAME(S): Aplenzin, Budeprion XL, Forfivo XL, Wellbutrin XL °What should I tell my health care provider before I take this medicine? °They need to know if you have any of these conditions: °· an eating disorder, such as anorexia or bulimia °· bipolar disorder or psychosis °· diabetes or high blood sugar, treated with medication °· glaucoma °· head injury or brain tumor °· heart disease, previous heart attack, or irregular heart beat °· high blood pressure °· kidney or liver disease °· seizures (convulsions) °· suicidal thoughts or a previous suicide attempt °· Tourette's syndrome °· weight loss °· an unusual or allergic reaction to bupropion, other medicines, foods, dyes, or preservatives °· breast-feeding °· pregnant or trying to become pregnant °How should I use this medicine? °Take this medicine by mouth with a glass of water. Follow the directions on the prescription label. You can take it with or without food. If it upsets your stomach, take it with food. Do not crush, chew, or cut these tablets. This medicine is taken once daily at the same time each day. Do not take your medicine more often than directed. Do not stop taking this medicine suddenly except upon the advice of your doctor. Stopping this medicine too quickly may cause serious side effects or your condition may worsen. °A special MedGuide will be given to you by the pharmacist with each prescription and refill. Be sure to read this information carefully each time. °Talk to your pediatrician regarding the use of this medicine in children. Special care may be needed. °Overdosage: If you think you have taken too much of this medicine contact a poison control center or emergency room  at once. °NOTE: This medicine is only for you. Do not share this medicine with others. °What if I miss a dose? °If you miss a dose, skip the missed dose and take your next tablet at the regular time. Do not take double or extra doses. °What may interact with this medicine? °Do not take this medicine with any of the following medications: °· linezolid °· MAOIs like Azilect, Carbex, Eldepryl, Marplan, Nardil, and Parnate °· methylene blue (injected into a vein) °· other medicines that contain bupropion like Zyban °This medicine may also interact with the following medications: °· alcohol °· certain medicines for anxiety or sleep °· certain medicines for blood pressure like metoprolol, propranolol °· certain medicines for depression or psychotic disturbances °· certain medicines for HIV or AIDS like efavirenz, lopinavir, nelfinavir, ritonavir °· certain medicines for irregular heart beat like propafenone, flecainide °· certain medicines for Parkinson's disease like amantadine, levodopa °· certain medicines for seizures like carbamazepine, phenytoin, phenobarbital °· cimetidine °· clopidogrel °· cyclophosphamide °· digoxin °· furazolidone °· isoniazid °· nicotine °· orphenadrine °· procarbazine °· steroid medicines like prednisone or cortisone °· stimulant medicines for attention disorders, weight loss, or to stay awake °· tamoxifen °· theophylline °· thiotepa °· ticlopidine °· tramadol °· warfarin °This list may not describe all possible interactions. Give your health care provider a list of all the medicines, herbs, non-prescription drugs, or dietary supplements you use. Also tell them if you smoke, drink alcohol, or use illegal drugs. Some items may interact with your medicine. °What should I watch for while using this medicine? °Tell your doctor if your symptoms do   not get better or if they get worse. Visit your doctor or healthcare provider for regular checks on your progress. Because it may take several weeks to  see the full effects of this medicine, it is important to continue your treatment as prescribed by your doctor. This medicine may cause serious skin reactions. They can happen weeks to months after starting the medicine. Contact your healthcare provider right away if you notice fevers or flu-like symptoms with a rash. The rash may be red or purple and then turn into blisters or peeling of the skin. Or, you might notice a red rash with swelling of the face, lips or lymph nodes in your neck or under your arms. Patients and their families should watch out for new or worsening thoughts of suicide or depression. Also watch out for sudden changes in feelings such as feeling anxious, agitated, panicky, irritable, hostile, aggressive, impulsive, severely restless, overly excited and hyperactive, or not being able to sleep. If this happens, especially at the beginning of treatment or after a change in dose, call your healthcare provider. Avoid alcoholic drinks while taking this medicine. Drinking large amounts of alcoholic beverages, using sleeping or anxiety medicines, or quickly stopping the use of these agents while taking this medicine may increase your risk for a seizure. Do not drive or use heavy machinery until you know how this medicine affects you. This medicine can impair your ability to perform these tasks. Do not take this medicine close to bedtime. It may prevent you from sleeping. Your mouth may get dry. Chewing sugarless gum or sucking hard candy, and drinking plenty of water may help. Contact your doctor if the problem does not go away or is severe. The tablet shell for some brands of this medicine does not dissolve. This is normal. The tablet shell may appear whole in the stool. This is not a cause for concern. What side effects may I notice from receiving this medicine? Side effects that you should report to your doctor or health care professional as soon as possible:  allergic reactions like  skin rash, itching or hives, swelling of the face, lips, or tongue  breathing problems  changes in vision  confusion  elevated mood, decreased need for sleep, racing thoughts, impulsive behavior  fast or irregular heartbeat  hallucinations, loss of contact with reality  increased blood pressure  rash, fever, and swollen lymph nodes  redness, blistering, peeling or loosening of the skin, including inside the mouth  seizures  suicidal thoughts or other mood changes  unusually weak or tired  vomiting Side effects that usually do not require medical attention (report to your doctor or health care professional if they continue or are bothersome):  constipation  headache  loss of appetite  nausea  tremors  weight loss This list may not describe all possible side effects. Call your doctor for medical advice about side effects. You may report side effects to FDA at 1-800-FDA-1088. Where should I keep my medicine? Keep out of the reach of children. Store at room temperature between 15 and 30 degrees C (59 and 86 degrees F). Throw away any unused medicine after the expiration date. NOTE: This sheet is a summary. It may not cover all possible information. If you have questions about this medicine, talk to your doctor, pharmacist, or health care provider.  2020 Elsevier/Gold Standard (2018-05-07 13:45:31) Otitis Externa  Otitis externa is an infection of the outer ear canal. The outer ear canal is the area between the outside  of the ear and the eardrum. Otitis externa is sometimes called swimmer's ear. What are the causes? Common causes of this condition include:  Swimming in dirty water.  Moisture in the ear.  An injury to the inside of the ear.  An object stuck in the ear.  A cut or scrape on the outside of the ear. What increases the risk? You are more likely to get this condition if you go swimming often. What are the signs or symptoms?  Itching in the ear.  This is often the first symptom.  Swelling of the ear.  Redness in the ear.  Ear pain. The pain may get worse when you pull on your ear.  Pus coming from the ear. How is this treated? This condition may be treated with:  Antibiotic ear drops. These are often given for 10-14 days.  Medicines to reduce itching and swelling. Follow these instructions at home:  If you were given antibiotic ear drops, use them as told by your doctor. Do not stop using them even if your condition gets better.  Take over-the-counter and prescription medicines only as told by your doctor.  Avoid getting water in your ears as told by your doctor. You may be told to avoid swimming or water sports for a few days.  Keep all follow-up visits as told by your doctor. This is important. How is this prevented?  Keep your ears dry. Use the corner of a towel to dry your ears after you swim or bathe.  Try not to scratch or put things in your ear. Doing these things makes it easier for germs to grow in your ear.  Avoid swimming in lakes, dirty water, or pools that may not have the right amount of a chemical called chlorine. Contact a doctor if:  You have a fever.  Your ear is still red, swollen, or painful after 3 days.  You still have pus coming from your ear after 3 days.  Your redness, swelling, or pain gets worse.  You have a really bad headache.  You have redness, swelling, pain, or tenderness behind your ear. Summary  Otitis externa is an infection of the outer ear canal.  Symptoms include pain, redness, and swelling of the ear.  If you were given antibiotic ear drops, use them as told by your doctor. Do not stop using them even if your condition gets better.  Try not to scratch or put things in your ear. This information is not intended to replace advice given to you by your health care provider. Make sure you discuss any questions you have with your health care provider. Document Revised:  07/18/2017 Document Reviewed: 07/18/2017 Elsevier Patient Education  2020 [employer]. Otitis Media, Adult  Otitis media means that the middle ear is red and swollen (inflamed) and full of fluid. The condition usually goes away on its own. Follow these instructions at home:  Take over-the-counter and prescription medicines only as told by your doctor.  If you were prescribed an antibiotic medicine, take it as told by your doctor. Do not stop taking the antibiotic even if you start to feel better.  Keep all follow-up visits as told by your doctor. This is important. Contact a doctor if:  You have bleeding from your nose.  There is a lump on your neck.  You are not getting better in 5 days.  You feel worse instead of better. Get help right away if:  You have pain that is not helped with  medicine.  You have swelling, redness, or pain around your ear.  You get a stiff neck.  You cannot move part of your face (paralyzed).  You notice that the bone behind your ear hurts when you touch it.  You get a very bad headache. Summary  Otitis media means that the middle ear is red, swollen, and full of fluid.  This condition usually goes away on its own. In some cases, treatment may be needed.  If you were prescribed an antibiotic medicine, take it as told by your doctor. This information is not intended to replace advice given to you by your health care provider. Make sure you discuss any questions you have with your health care provider. Document Revised: 01/24/2017 Document Reviewed: 03/04/2016 Elsevier Patient Education  2020 [employer].

## 2020-01-04 ENCOUNTER — Encounter: Payer: Self-pay | Admitting: Adult Health

## 2020-01-04 DIAGNOSIS — H60502 Unspecified acute noninfective otitis externa, left ear: Secondary | ICD-10-CM

## 2020-01-04 DIAGNOSIS — H6692 Otitis media, unspecified, left ear: Secondary | ICD-10-CM

## 2020-01-04 HISTORY — DX: Otitis media, unspecified, left ear: H66.92

## 2020-01-04 HISTORY — DX: Unspecified acute noninfective otitis externa, left ear: H60.502

## 2020-01-31 ENCOUNTER — Ambulatory Visit (INDEPENDENT_AMBULATORY_CARE_PROVIDER_SITE_OTHER): Payer: BC Managed Care – PPO | Admitting: Adult Health

## 2020-01-31 ENCOUNTER — Other Ambulatory Visit: Payer: Self-pay

## 2020-01-31 ENCOUNTER — Encounter: Payer: Self-pay | Admitting: Adult Health

## 2020-01-31 VITALS — BP 119/82 | HR 76 | Temp 98.1°F | Resp 16 | Wt 209.0 lb

## 2020-01-31 DIAGNOSIS — Z6829 Body mass index (BMI) 29.0-29.9, adult: Secondary | ICD-10-CM

## 2020-01-31 DIAGNOSIS — E559 Vitamin D deficiency, unspecified: Secondary | ICD-10-CM | POA: Diagnosis not present

## 2020-01-31 DIAGNOSIS — S39012A Strain of muscle, fascia and tendon of lower back, initial encounter: Secondary | ICD-10-CM

## 2020-01-31 DIAGNOSIS — F332 Major depressive disorder, recurrent severe without psychotic features: Secondary | ICD-10-CM

## 2020-01-31 DIAGNOSIS — H60502 Unspecified acute noninfective otitis externa, left ear: Secondary | ICD-10-CM

## 2020-01-31 DIAGNOSIS — F419 Anxiety disorder, unspecified: Secondary | ICD-10-CM

## 2020-01-31 MED ORDER — AZITHROMYCIN 250 MG PO TABS: ORAL_TABLET | ORAL | 0 refills | Status: DC

## 2020-01-31 MED ORDER — PREDNISONE 10 MG (21) PO TBPK: ORAL_TABLET | ORAL | 0 refills | Status: DC

## 2020-01-31 MED ORDER — CYCLOBENZAPRINE HCL 10 MG PO TABS: 10.0000 mg | ORAL_TABLET | Freq: Three times a day (TID) | ORAL | 0 refills | Status: DC | PRN

## 2020-01-31 NOTE — Progress Notes (Signed)
Established patient visit   Patient: Nathaniel Hess   DOB: [birth year]   49 y.o. Male  MRN: [id number] Visit Date: 01/31/2020  Today's healthcare provider: Jairo Ben, FNP   Chief Complaint  Patient presents with  . Anxiety   Subjective    HPI  Anxiety, Follow-up  He was last seen for anxiety 1 months ago. Changes made at last visit include starting patient on Wellbutrin 300mg  and Alprazolam 0.25mg .He is able to function with Wellbutrin where he could not with his SSRI in past it had made him feel very sluggish.    He reports excellent compliance with treatment. He reports good tolerance of treatment. He is not having side effects.    He is sleeping better with the xanax and using a few times.   He feels his anxiety is mild and Improved since last visit.  Denies any suicidal or homicidal ideations.  Dr. office called for psychiatry.   Patient  denies any fever, body aches,chills, rash, chest pain, shortness of breath, nausea, vomiting, or diarrhea.    He has had some stiffness in his lower back for a few days. More right sided .he feels he may have slept wrong. He works night shift.   Denies any loss of bowel or bladder control.  Denies saddle paresthesias.  Denies radiculopathy/ paresthesias.    Congestion cleared up some with He finished Augmentin x 10 days. He still has some congestion mostly clear. Cough improving.   Patient  denies any fever,chills, rash, chest pain, shortness of breath, nausea, vomiting, or diarrhea.  Denies dizziness, lightheadedness, pre syncopal or syncopal episodes.    Symptoms: No chest pain No difficulty concentrating  No dizziness No fatigue  No feelings of losing control No insomnia  Yes irritable No palpitations  No panic attacks No racing thoughts  No shortness of breath No sweating  No tremors/shakes    GAD-7 Results GAD-7 Generalized Anxiety Disorder Screening Tool 09/24/2016  1. Feeling Nervous,  Anxious, or on Edge 3  2. Not Being Able to Stop or Control Worrying 3  3. Worrying Too Much About Different Things 3  4. Trouble Relaxing 3  5. Being So Restless it's Hard To Sit Still 0  6. Becoming Easily Annoyed or Irritable 3  7. Feeling Afraid As If Something Awful Might Happen 0  Total GAD-7 Score 15  Difficulty At Work, Home, or Getting  Along With Others? Very difficult    PHQ-9 Scores PHQ9 SCORE ONLY 12/31/2019 11/30/2019 11/30/2019  PHQ-9 Total Score 14 1 0    ---------------------------------------------------------------------------------------------------   Patient Active Problem List   Diagnosis Date Noted  . Lumbosacral strain 01/31/2020  . Vitamin D insufficiency 01/31/2020  . Body mass index 29.0-29.9, adult 01/31/2020  . Left otitis media 01/04/2020  . Acute otitis externa of left ear 01/04/2020  . Screening for blood or protein in urine 11/30/2019  . Fatigue 11/30/2019  . Anxiety 11/30/2019  . Depression 09/24/2016  . Constipation 11/23/2015  . Generalized anxiety disorder 11/13/2015  . Serotonin withdrawal syndrome 11/13/2015  . SBO (small bowel obstruction) (HCC) 11/06/2015  . GERD (gastroesophageal reflux disease) 05/09/2014   Past Medical History:  Diagnosis Date  . Anxiety   . Barrett esophagus   . Barrett's esophagus   . Bowel obstruction (HCC)   . Depression   . GERD (gastroesophageal reflux disease)    No Known Allergies     Medications: Outpatient Medications Prior to Visit  Medication Sig  .  ALPRAZolam (XANAX) 0.25 MG tablet Take 1 tablet (0.25 mg total) by mouth at bedtime as needed for anxiety or sleep. Do not use every day  . benzonatate (TESSALON) 100 MG capsule Take 1 capsule (100 mg total) by mouth at bedtime as needed for cough.  Marland Kitchen. buPROPion (WELLBUTRIN XL) 300 MG 24 hr tablet Take 1 tablet (300 mg total) by mouth daily. Take half tablet for first 7 days then increase to 1 tablet daily.  . Vitamin D, Ergocalciferol, (DRISDOL)  1.25 MG (50000 UNIT) CAPS capsule Take 1 capsule (50,000 Units total) by mouth every 7 (seven) days. (taking one tablet per week)check lab the following 1-2 weeks after completing.  . [DISCONTINUED] amoxicillin-clavulanate (AUGMENTIN) 875-125 MG tablet Take 1 tablet by mouth 2 (two) times daily.  . [DISCONTINUED] predniSONE (STERAPRED UNI-PAK 21 TAB) 10 MG (21) TBPK tablet PO: Take 6 tablets on day 1:Take 5 tablets day 2:Take 4 tablets day 3: Take 3 tablets day 4:Take 2 tablets day five: 5 Take 1 tablet day 6   No facility-administered medications prior to visit.    Review of Systems  Constitutional: Negative.   HENT: Positive for congestion. Negative for sore throat.   Respiratory: Positive for cough. Negative for apnea, choking, chest tightness, shortness of breath, wheezing and stridor.   Cardiovascular: Negative.   Gastrointestinal: Negative.   Genitourinary: Negative.   Musculoskeletal: Positive for back pain and myalgias. Negative for arthralgias, gait problem, joint swelling and neck pain.  Skin: Negative.  Negative for color change and rash.  Neurological: Negative.   Psychiatric/Behavioral: Negative.        Improved anxiety and depression.     Last CBC Lab Results  Component Value Date   WBC 6.4 12/01/2019   HGB 15.1 12/01/2019   HCT 44.3 12/01/2019   MCV 89 12/01/2019   MCH 30.4 12/01/2019   RDW 13.7 12/01/2019   PLT 273 12/01/2019   Last metabolic panel Lab Results  Component Value Date   GLUCOSE 92 12/01/2019   NA 140 12/01/2019   K 4.1 12/01/2019   CL 106 12/01/2019   CO2 21 12/01/2019   BUN 8 12/01/2019   CREATININE 0.82 12/01/2019   GFRNONAA 105 12/01/2019   GFRAA 121 12/01/2019   CALCIUM 8.5 (L) 12/01/2019   PROT 5.3 (L) 12/01/2019   ALBUMIN 3.7 (L) 12/01/2019   LABGLOB 1.6 12/01/2019   AGRATIO 2.3 (H) 12/01/2019   BILITOT 0.3 12/01/2019   ALKPHOS 51 12/01/2019   AST 21 12/01/2019   ALT 15 12/01/2019   ANIONGAP 6 11/22/2015   Last lipids Lab  Results  Component Value Date   CHOL 148 12/01/2019   HDL 45 12/01/2019   LDLCALC 85 12/01/2019   TRIG 97 12/01/2019   CHOLHDL 3.3 12/01/2019   Last hemoglobin A1c No results found for: HGBA1C Last thyroid functions Lab Results  Component Value Date   TSH 1.440 12/01/2019   Last vitamin D Lab Results  Component Value Date   VD25OH 15.1 (L) 12/01/2019   Last vitamin B12 and Folate No results found for: VITAMINB12, FOLATE    Objective    BP 119/82   Pulse 76   Temp 98.1 F (36.7 C) (Oral)   Resp 16   Wt 209 lb (94.8 kg)   BMI 29.56 kg/m  BP Readings from Last 3 Encounters:  01/31/20 119/82  12/31/19 118/78  11/30/19 117/84   Wt Readings from Last 3 Encounters:  01/31/20 209 lb (94.8 kg)  11/30/19 204 lb 3.2 oz (  92.6 kg)  09/24/16 205 lb (93 kg)      Physical Exam Vitals reviewed.  Constitutional:      General: He is not in acute distress.    Appearance: He is not ill-appearing, toxic-appearing or diaphoretic.     Comments: Patient is alert and oriented and responsive to questions Engages in eye contact with provider. Speaks in full sentences without any pauses without any shortness of breath or distress.    HENT:     Head: Normocephalic and atraumatic.     Right Ear: Tympanic membrane, ear canal and external ear normal. There is no impacted cerumen.     Nose: Congestion and rhinorrhea present.     Mouth/Throat:     Mouth: Mucous membranes are moist.     Pharynx: No oropharyngeal exudate or posterior oropharyngeal erythema.  Eyes:     General: No scleral icterus.       Right eye: No discharge.        Left eye: No discharge.     Conjunctiva/sclera: Conjunctivae normal.  Neck:     Vascular: No carotid bruit.  Cardiovascular:     Rate and Rhythm: Normal rate and regular rhythm.     Pulses: Normal pulses.     Heart sounds: Normal heart sounds. No murmur heard.  No friction rub. No gallop.   Pulmonary:     Effort: Pulmonary effort is normal. No  respiratory distress.     Breath sounds: Normal breath sounds. No stridor. No wheezing, rhonchi or rales.  Chest:     Chest wall: No tenderness.  Abdominal:     General: There is no distension.     Palpations: Abdomen is soft.     Tenderness: There is no abdominal tenderness.  Musculoskeletal:        General: Tenderness present. No swelling, deformity or signs of injury.     Cervical back: Normal, normal range of motion and neck supple. No rigidity or tenderness.     Thoracic back: Normal.     Lumbar back: Spasms and tenderness present. No swelling, edema, deformity, signs of trauma, lacerations or bony tenderness. Normal range of motion. Negative right straight leg raise test and negative left straight leg raise test. No scoliosis.       Back:     Right lower leg: No edema.     Left lower leg: No edema.  Lymphadenopathy:     Cervical: No cervical adenopathy.  Skin:    General: Skin is warm.     Findings: No erythema or rash.  Neurological:     Mental Status: He is oriented to person, place, and time.  Psychiatric:        Mood and Affect: Mood normal.        Behavior: Behavior normal.        Thought Content: Thought content normal.        Judgment: Judgment normal.      No results found for any visits on 01/31/20.  Assessment & Plan     Lumbosacral strain, initial encounter  Acute otitis externa of left ear, unspecified type  Vitamin D insufficiency  Body mass index 29.0-29.9, adult  Anxiety  Severe episode of recurrent major depressive disorder, without psychotic features (HCC)  Lab orders are in for recheck CMP and vitamin D recheck.  Recheck as previously directed.   Contact pharmacy for refills. None needed at today's visit.   Wellbutrin continue - use xanax only as needed/ directed. Hope is to discontinue  once Wellbutrin reaches full effectiveness.  Discussed known black box warning for anti depression/ anxiety medication. Need to report any behavioral  changes right, if any homicidal or suicidal thoughts or ideas seek medical attention right away. Call 911.    Discussed medications below and side effects.  Meds ordered this encounter  Medications  . cyclobenzaprine (FLEXERIL) 10 MG tablet    Sig: Take 1 tablet (10 mg total) by mouth 3 (three) times daily as needed for muscle spasms (will make you drowsy, recomend at bedtime only  if sufficent relief.).    Dispense:  30 tablet    Refill:  0  . predniSONE (STERAPRED UNI-PAK 21 TAB) 10 MG (21) TBPK tablet    Sig: PO: Take 6 tablets on day 1:Take 5 tablets day 2:Take 4 tablets day 3: Take 3 tablets day 4:Take 2 tablets day five: 5 Take 1 tablet day 6    Dispense:  21 tablet    Refill:  0  . azithromycin (ZITHROMAX) 250 MG tablet    Sig: By mouth Take 2 tablets day 1 (500mg  total) and 1 tablet ( 250 mg ) on days 2,3,4,5.    Dispense:  6 tablet    Refill:  0   Feeling much better with medication now for anxiety/ depression, follow up in 3 months, also will need to address Vitamin D refill of needed after lab. GAD and PHQ 9 needed at next visit.   Has been referred to psychiatry with [facility].   Red Flags discussed. The patient was given clear instructions to go to ER or return to medical center if any red flags develop, symptoms do not improve, worsen or new problems develop. They verbalized understanding.  Return in about 3 months (around 04/30/2020), or if symptoms worsen or fail to improve, for at any time for any worsening symptoms, Go to Emergency room/ urgent care if worse.        06/30/2020, FNP  [facility] [phone] (phone) [phone] (fax)  [facility]

## 2020-01-31 NOTE — Patient Instructions (Addendum)
Muscle Cramps and Spasms Muscle cramps and spasms are when muscles tighten by themselves. They usually get better within minutes. Muscle cramps are painful. They are usually stronger and last longer than muscle spasms. Muscle spasms may or may not be painful. They can last a few seconds or much longer. Cramps and spasms can affect any muscle, but they occur most often in the calf muscles of the leg. They are usually not caused by a serious problem. In many cases, the cause is not known. Some common causes include:  Doing more physical work or exercise than your body is ready for.  Using the muscles too much (overuse) by repeating certain movements too many times.  Staying in a certain position for a long time.  Playing a sport or doing an activity without preparing properly.  Using bad form or technique while playing a sport or doing an activity.  Not having enough water in your body (dehydration).  Injury.  Side effects of some medicines.  Low levels of the salts and minerals in your blood (electrolytes), such as low potassium or calcium. Follow these instructions at home: Managing pain and stiffness      Massage, stretch, and relax the muscle. Do this for many minutes at a time.  If told, put heat on tight or tense muscles as often as told by your doctor. Use the heat source that your doctor recommends, such as a moist heat pack or a heating pad. ? Place a towel between your skin and the heat source. ? Leave the heat on for 20-30 minutes. ? Remove the heat if your skin turns bright red. This is very important if you are not able to feel pain, heat, or cold. You may have a greater risk of getting burned.  If told, put ice on the affected area. This may help if you are sore or have pain after a cramp or spasm. ? Put ice in a plastic bag. ? Place a towel between your skin and the bag. ? Leave the ice on for 20 minutes, 2-3 times a day.  Try taking hot showers or baths to help  relax tight muscles. Eating and drinking  Drink enough fluid to keep your pee (urine) pale yellow.  Eat a healthy diet to help ensure that your muscles work well. This should include: ? Fruits and vegetables. ? Lean protein. ? Whole grains. ? Low-fat or nonfat dairy products. General instructions  If you are having cramps often, avoid intense exercise for several days.  Take over-the-counter and prescription medicines only as told by your doctor.  Watch for any changes in your symptoms.  Keep all follow-up visits as told by your doctor. This is important. Contact a doctor if:  Your cramps or spasms get worse or happen more often.  Your cramps or spasms do not get better with time. Summary  Muscle cramps and spasms are when muscles tighten by themselves. They usually get better within minutes.  Cramps and spasms occur most often in the calf muscles of the leg.  Massage, stretch, and relax the muscle. This may help the cramp or spasm go away.  Drink enough fluid to keep your pee (urine) pale yellow. This information is not intended to replace advice given to you by your health care provider. Make sure you discuss any questions you have with your health care provider. Document Revised: 07/07/2017 Document Reviewed: 07/07/2017 Elsevier Patient Education  2020 [employer] Sinusitis, Adult Sinusitis is soreness and swelling (inflammation) of  your sinuses. Sinuses are hollow spaces in the bones around your face. They are located:  Around your eyes.  In the middle of your forehead.  Behind your nose.  In your cheekbones. Your sinuses and nasal passages are lined with a fluid called mucus. Mucus drains out of your sinuses. Swelling can trap mucus in your sinuses. This lets germs (bacteria, virus, or fungus) grow, which leads to infection. Most of the time, this condition is caused by a virus. What are the causes? This condition is caused  by:  Allergies.  Asthma.  Germs.  Things that block your nose or sinuses.  Growths in the nose (nasal polyps).  Chemicals or irritants in the air.  Fungus (rare). What increases the risk? You are more likely to develop this condition if:  You have a weak body defense system (immune system).  You do a lot of swimming or diving.  You use nasal sprays too much.  You smoke. What are the signs or symptoms? The main symptoms of this condition are pain and a feeling of pressure around the sinuses. Other symptoms include:  Stuffy nose (congestion).  Runny nose (drainage).  Swelling and warmth in the sinuses.  Headache.  Toothache.  A cough that may get worse at night.  Mucus that collects in the throat or the back of the nose (postnasal drip).  Being unable to smell and taste.  Being very tired (fatigue).  A fever.  Sore throat.  Bad breath. How is this diagnosed? This condition is diagnosed based on:  Your symptoms.  Your medical history.  A physical exam.  Tests to find out if your condition is short-term (acute) or long-term (chronic). Your doctor may: ? Check your nose for growths (polyps). ? Check your sinuses using a tool that has a light (endoscope). ? Check for allergies or germs. ? Do imaging tests, such as an MRI or CT scan. How is this treated? Treatment for this condition depends on the cause and whether it is short-term or long-term.  If caused by a virus, your symptoms should go away on their own within 10 days. You may be given medicines to relieve symptoms. They include: ? Medicines that shrink swollen tissue in the nose. ? Medicines that treat allergies (antihistamines). ? A spray that treats swelling of the nostrils. ? Rinses that help get rid of thick mucus in your nose (nasal saline washes).  If caused by bacteria, your doctor may wait to see if you will get better without treatment. You may be given antibiotic medicine if you  have: ? A very bad infection. ? A weak body defense system.  If caused by growths in the nose, you may need to have surgery. Follow these instructions at home: Medicines  Take, use, or apply over-the-counter and prescription medicines only as told by your doctor. These may include nasal sprays.  If you were prescribed an antibiotic medicine, take it as told by your doctor. Do not stop taking the antibiotic even if you start to feel better. Hydrate and humidify   Drink enough water to keep your pee (urine) pale yellow.  Use a cool mist humidifier to keep the humidity level in your home above 50%.  Breathe in steam for 10-15 minutes, 3-4 times a day, or as told by your doctor. You can do this in the bathroom while a hot shower is running.  Try not to spend time in cool or dry air. Rest  Rest as much as you can.  Sleep with your head raised (elevated).  Make sure you get enough sleep each night. General instructions   Put a warm, moist washcloth on your face 3-4 times a day, or as often as told by your doctor. This will help with discomfort.  Wash your hands often with soap and water. If there is no soap and water, use hand sanitizer.  Do not smoke. Avoid being around people who are smoking (secondhand smoke).  Keep all follow-up visits as told by your doctor. This is important. Contact a doctor if:  You have a fever.  Your symptoms get worse.  Your symptoms do not get better within 10 days. Get help right away if:  You have a very bad headache.  You cannot stop throwing up (vomiting).  You have very bad pain or swelling around your face or eyes.  You have trouble seeing.  You feel confused.  Your neck is stiff.  You have trouble breathing. Summary  Sinusitis is swelling of your sinuses. Sinuses are hollow spaces in the bones around your face.  This condition is caused by tissues in your nose that become inflamed or swollen. This traps germs. These can  lead to infection.  If you were prescribed an antibiotic medicine, take it as told by your doctor. Do not stop taking it even if you start to feel better.  Keep all follow-up visits as told by your doctor. This is important. This information is not intended to replace advice given to you by your health care provider. Make sure you discuss any questions you have with your health care provider. Document Revised: 07/14/2017 Document Reviewed: 07/14/2017 Elsevier Patient Education  2020 [employer]. Managing Anxiety, Adult After being diagnosed with an anxiety disorder, you may be relieved to know why you have felt or behaved a certain way. You may also feel overwhelmed about the treatment ahead and what it will mean for your life. With care and support, you can manage this condition and recover from it. How to manage lifestyle changes Managing stress and anxiety  Stress is your body's reaction to life changes and events, both good and bad. Most stress will last just a few hours, but stress can be ongoing and can lead to more than just stress. Although stress can play a major role in anxiety, it is not the same as anxiety. Stress is usually caused by something external, such as a deadline, test, or competition. Stress normally passes after the triggering event has ended.  Anxiety is caused by something internal, such as imagining a terrible outcome or worrying that something will go wrong that will devastate you. Anxiety often does not go away even after the triggering event is over, and it can become long-term (chronic) worry. It is important to understand the differences between stress and anxiety and to manage your stress effectively so that it does not lead to an anxious response. Talk with your health care provider or a counselor to learn more about reducing anxiety and stress. He or she may suggest tension reduction techniques, such as:  Music therapy. This can include creating or listening to  music that you enjoy and that inspires you.  Mindfulness-based meditation. This involves being aware of your normal breaths while not trying to control your breathing. It can be done while sitting or walking.  Centering prayer. This involves focusing on a word, phrase, or sacred image that means something to you and brings you peace.  Deep breathing. To do this, expand your  stomach and inhale slowly through your nose. Hold your breath for 3-5 seconds. Then exhale slowly, letting your stomach muscles relax.  Self-talk. This involves identifying thought patterns that lead to anxiety reactions and changing those patterns.  Muscle relaxation. This involves tensing muscles and then relaxing them. Choose a tension reduction technique that suits your lifestyle and personality. These techniques take time and practice. Set aside 5-15 minutes a day to do them. Therapists can offer counseling and training in these techniques. The training to help with anxiety may be covered by some insurance plans. Other things you can do to manage stress and anxiety include:  Keeping a stress/anxiety diary. This can help you learn what triggers your reaction and then learn ways to manage your response.  Thinking about how you react to certain situations. You may not be able to control everything, but you can control your response.  Making time for activities that help you relax and not feeling guilty about spending your time in this way.  Visual imagery and yoga can help you stay calm and relax.  Medicines Medicines can help ease symptoms. Medicines for anxiety include:  Anti-anxiety drugs.  Antidepressants. Medicines are often used as a primary treatment for anxiety disorder. Medicines will be prescribed by a health care provider. When used together, medicines, psychotherapy, and tension reduction techniques may be the most effective treatment. Relationships Relationships can play a big part in helping you  recover. Try to spend more time connecting with trusted friends and family members. Consider going to couples counseling, taking family education classes, or going to family therapy. Therapy can help you and others better understand your condition. How to recognize changes in your anxiety Everyone responds differently to treatment for anxiety. Recovery from anxiety happens when symptoms decrease and stop interfering with your daily activities at home or work. This may mean that you will start to:  Have better concentration and focus. Worry will interfere less in your daily thinking.  Sleep better.  Be less irritable.  Have more energy.  Have improved memory. It is important to recognize when your condition is getting worse. Contact your health care provider if your symptoms interfere with home or work and you feel like your condition is not improving. Follow these instructions at home: Activity  Exercise. Most adults should do the following: ? Exercise for at least 150 minutes each week. The exercise should increase your heart rate and make you sweat (moderate-intensity exercise). ? Strengthening exercises at least twice a week.  Get the right amount and quality of sleep. Most adults need 7-9 hours of sleep each night. Lifestyle   Eat a healthy diet that includes plenty of vegetables, fruits, whole grains, low-fat dairy products, and lean protein. Do not eat a lot of foods that are high in solid fats, added sugars, or salt.  Make choices that simplify your life.  Do not use any products that contain nicotine or tobacco, such as cigarettes, e-cigarettes, and chewing tobacco. If you need help quitting, ask your health care provider.  Avoid caffeine, alcohol, and certain over-the-counter cold medicines. These may make you feel worse. Ask your pharmacist which medicines to avoid. General instructions  Take over-the-counter and prescription medicines only as told by your health care  provider.  Keep all follow-up visits as told by your health care provider. This is important. Where to find support You can get help and support from these sources:  Self-help groups.  Online and [employer].  A trusted spiritual leader.  Couples counseling.  Family education classes.  Family therapy. Where to find more information You may find that joining a support group helps you deal with your anxiety. The following sources can help you locate counselors or support groups near you:  Mental Health [place]: www.mentalhealthamerica.net  Anxiety [employer] Association of [place] (ADAA): ProgramCam.de  [employer] Mental [employer]ness (NAMI): www.nami.org Contact a health care provider if you:  Have a hard time staying focused or finishing daily tasks.  Spend many hours a day feeling worried about everyday life.  Become exhausted by worry.  Start to have headaches, feel tense, or have nausea.  Urinate more than normal.  Have diarrhea. Get help right away if you have:  A racing heart and shortness of breath.  Thoughts of hurting yourself or others. If you ever feel like you may hurt yourself or others, or have thoughts about taking your own life, get help right away. You can go to your nearest emergency department or call:  Your local emergency services (911 in the [place]  A suicide crisis helpline, such as the National Suicide Prevention Lifeline at 782-197-0103. This is open 24 hours a day. Summary  Taking steps to learn and use tension reduction techniques can help calm you and help prevent triggering an anxiety reaction.  When used together, medicines, psychotherapy, and tension reduction techniques may be the most effective treatment.  Family, friends, and partners can play a big part in helping you recover from an anxiety disorder. This information is not intended to replace advice given to you by your health care provider. Make sure you  discuss any questions you have with your health care provider. Document Revised: 07/14/2018 Document Reviewed: 07/14/2018 Elsevier Patient Education  2020 Else[employer] Inc.   Calorie Counting for [employer] Calories are units of energy. Your body needs a certain amount of calories from food to keep you going throughout the day. When you eat more calories than your body needs, your body stores the extra calories as fat. When you eat fewer calories than your body needs, your body burns fat to get the energy it needs. Calorie counting means keeping track of how many calories you eat and drink each day. Calorie counting can be helpful if you need to lose weight. If you make sure to eat fewer calories than your body needs, you should lose weight. Ask your health care provider what a healthy weight is for you. For calorie counting to work, you will need to eat the right number of calories in a day in order to lose a healthy amount of weight per week. A dietitian can help you determine how many calories you need in a day and will give you suggestions on how to reach your calorie goal.  A healthy amount of weight to lose per week is usually 1-2 lb (0.5-0.9 kg). This usually means that your daily calorie intake should be reduced by 500-750 calories.  Eating 1,200 - 1,500 calories per day can help most women lose weight.  Eating 1,500 - 1,800 calories per day can help most men lose weight. What is my plan? My goal is to have __________ calories per day. If I have this many calories per day, I should lose around __________ pounds per week. What do I need to know about calorie counting? In order to meet your daily calorie goal, you will need to:  Find out how many calories are in each food you would like to eat. Try to do this  before you eat.  Decide how much of the food you plan to eat.  Write down what you ate and how many calories it had. Doing this is called keeping a food log. To successfully lose  weight, it is important to balance calorie counting with a healthy lifestyle that includes regular activity. Aim for 150 minutes of moderate exercise (such as walking) or 75 minutes of vigorous exercise (such as running) each week. Where do I find calorie information?  The number of calories in a food can be found on a Nutrition Facts label. If a food does not have a Nutrition Facts label, try to look up the calories online or ask your dietitian for help. Remember that calories are listed per serving. If you choose to have more than one serving of a food, you will have to multiply the calories per serving by the amount of servings you plan to eat. For example, the label on a package of bread might say that a serving size is 1 slice and that there are 90 calories in a serving. If you eat 1 slice, you will have eaten 90 calories. If you eat 2 slices, you will have eaten 180 calories. How do I keep a food log? Immediately after each meal, record the following information in your food log:  What you ate. Don't forget to include toppings, sauces, and other extras on the food.  How much you ate. This can be measured in cups, ounces, or number of items.  How many calories each food and drink had.  The total number of calories in the meal. Keep your food log near you, such as in a small notebook in your pocket, or use a mobile app or website. Some programs will calculate calories for you and show you how many calories you have left for the day to meet your goal. What are some calorie counting tips?   Use your calories on foods and drinks that will fill you up and not leave you hungry: ? Some examples of foods that fill you up are nuts and nut butters, vegetables, lean proteins, and high-fiber foods like whole grains. High-fiber foods are foods with more than 5 g fiber per serving. ? Drinks such as sodas, specialty coffee drinks, alcohol, and juices have a lot of calories, yet do not fill you up.  Eat  nutritious foods and avoid empty calories. Empty calories are calories you get from foods or beverages that do not have many vitamins or protein, such as candy, sweets, and soda. It is better to have a nutritious high-calorie food (such as an avocado) than a food with few nutrients (such as a bag of chips).  Know how many calories are in the foods you eat most often. This will help you calculate calorie counts faster.  Pay attention to calories in drinks. Low-calorie drinks include water and unsweetened drinks.  Pay attention to nutrition labels for "low fat" or "fat free" foods. These foods sometimes have the same amount of calories or more calories than the full fat versions. They also often have added sugar, starch, or salt, to make up for flavor that was removed with the fat.  Find a way of tracking calories that works for you. Get creative. Try different apps or programs if writing down calories does not work for you. What are some portion control tips?  Know how many calories are in a serving. This will help you know how many servings of a certain food you  can have.  Use a measuring cup to measure serving sizes. You could also try weighing out portions on a kitchen scale. With time, you will be able to estimate serving sizes for some foods.  Take some time to put servings of different foods on your favorite plates, bowls, and cups so you know what a serving looks like.  Try not to eat straight from a bag or box. Doing this can lead to overeating. Put the amount you would like to eat in a cup or on a plate to make sure you are eating the right portion.  Use smaller plates, glasses, and bowls to prevent overeating.  Try not to multitask (for example, watch TV or use your computer) while eating. If it is time to eat, sit down at a table and enjoy your food. This will help you to know when you are full. It will also help you to be aware of what you are eating and how much you are eating. What  are tips for following this plan? Reading food labels  Check the calorie count compared to the serving size. The serving size may be smaller than what you are used to eating.  Check the source of the calories. Make sure the food you are eating is high in vitamins and protein and low in saturated and trans fats. Shopping  Read nutrition labels while you shop. This will help you make healthy decisions before you decide to purchase your food.  Make a grocery list and stick to it. Cooking  Try to cook your favorite foods in a healthier way. For example, try baking instead of frying.  Use low-fat dairy products. Meal planning  Use more fruits and vegetables. Half of your plate should be fruits and vegetables.  Include lean proteins like poultry and fish. How do I count calories when eating out?  Ask for smaller portion sizes.  Consider sharing an entree and sides instead of getting your own entree.  If you get your own entree, eat only half. Ask for a box at the beginning of your meal and put the rest of your entree in it so you are not tempted to eat it.  If calories are listed on the menu, choose the lower calorie options.  Choose dishes that include vegetables, fruits, whole grains, low-fat dairy products, and lean protein.  Choose items that are boiled, broiled, grilled, or steamed. Stay away from items that are buttered, battered, fried, or served with cream sauce. Items labeled "crispy" are usually fried, unless stated otherwise.  Choose water, low-fat milk, unsweetened iced tea, or other drinks without added sugar. If you want an alcoholic beverage, choose a lower calorie option such as a glass of wine or light beer.  Ask for dressings, sauces, and syrups on the side. These are usually high in calories, so you should limit the amount you eat.  If you want a salad, choose a garden salad and ask for grilled meats. Avoid extra toppings like bacon, cheese, or fried items. Ask for  the dressing on the side, or ask for olive oil and vinegar or lemon to use as dressing.  Estimate how many servings of a food you are given. For example, a serving of cooked rice is  cup or about the size of half a baseball. Knowing serving sizes will help you be aware of how much food you are eating at restaurants. The list below tells you how big or small some common portion sizes are based  on everyday objects: ? 1 oz--4 stacked dice. ? 3 oz--1 deck of cards. ? 1 tsp--1 die. ? 1 Tbsp-- a ping-pong ball. ? 2 Tbsp--1 ping-pong ball. ?  cup-- baseball. ? 1 cup--1 baseball. Summary  Calorie counting means keeping track of how many calories you eat and drink each day. If you eat fewer calories than your body needs, you should lose weight.  A healthy amount of weight to lose per week is usually 1-2 lb (0.5-0.9 kg). This usually means reducing your daily calorie intake by 500-750 calories.  The number of calories in a food can be found on a Nutrition Facts label. If a food does not have a Nutrition Facts label, try to look up the calories online or ask your dietitian for help.  Use your calories on foods and drinks that will fill you up, and not on foods and drinks that will leave you hungry.  Use smaller plates, glasses, and bowls to prevent overeating. This information is not intended to replace advice given to you by your health care provider. Make sure you discuss any questions you have with your health care provider. Document Revised: 10/31/2017 Document Reviewed: 01/12/2016 Elsevier Patient Education  2020 Elsevier Inc.   Fat and Cholesterol Restricted Eating Plan Getting too much fat and cholesterol in your diet may cause health problems. Choosing the right foods helps keep your fat and cholesterol at normal levels. This can keep you from getting certain diseases. Your doctor may recommend an eating plan that includes:  Total fat: ______% or less of total calories a  day.  Saturated fat: ______% or less of total calories a day.  Cholesterol: less than _________mg a day.  Fiber: ______g a day. What are tips for following this plan? Meal planning  At meals, divide your plate into four equal parts: ? Fill one-half of your plate with vegetables and green salads. ? Fill one-fourth of your plate with whole grains. ? Fill one-fourth of your plate with low-fat (lean) protein foods.  Eat fish that is high in omega-3 fats at least two times a week. This includes mackerel, tuna, sardines, and salmon.  Eat foods that are high in fiber, such as whole grains, beans, apples, broccoli, carrots, peas, and barley. General tips   Work with your doctor to lose weight if you need to.  Avoid: ? Foods with added sugar. ? Fried foods. ? Foods with partially hydrogenated oils.  Limit alcohol intake to no more than 1 drink a day for nonpregnant women and 2 drinks a day for men. One drink equals 12 oz of beer, 5 oz of wine, or 1 oz of hard liquor. Reading food labels  Check food labels for: ? Trans fats. ? Partially hydrogenated oils. ? Saturated fat (g) in each serving. ? Cholesterol (mg) in each serving. ? Fiber (g) in each serving.  Choose foods with healthy fats, such as: ? Monounsaturated fats. ? Polyunsaturated fats. ? Omega-3 fats.  Choose grain products that have whole grains. Look for the word "whole" as the first word in the ingredient list. Cooking  Cook foods using low-fat methods. These include baking, boiling, grilling, and broiling.  Eat more home-cooked foods. Eat at restaurants and buffets less often.  Avoid cooking using saturated fats, such as butter, cream, palm oil, palm kernel oil, and coconut oil. Recommended foods  Fruits  All fresh, canned (in natural juice), or frozen fruits. Vegetables  Fresh or frozen vegetables (raw, steamed, roasted, or grilled). Green salads. Grains  Whole grains, such as whole wheat or whole  grain breads, crackers, cereals, and pasta. Unsweetened oatmeal, bulgur, barley, quinoa, or brown rice. Corn or whole wheat flour tortillas. Meats and other protein foods  Ground beef (85% or leaner), grass-fed beef, or beef trimmed of fat. Skinless chicken or [place]. Ground chicken or [place]. Pork trimmed of fat. All fish and seafood. Egg whites. Dried beans, peas, or lentils. Unsalted nuts or seeds. Unsalted canned beans. Nut butters without added sugar or oil. Dairy  Low-fat or nonfat dairy products, such as skim or 1% milk, 2% or reduced-fat cheeses, low-fat and fat-free ricotta or cottage cheese, or plain low-fat and nonfat yogurt. Fats and oils  Tub margarine without trans fats. Light or reduced-fat mayonnaise and salad dressings. Avocado. Olive, canola, sesame, or safflower oils. The items listed above may not be a complete list of foods and beverages you can eat. Contact a dietitian for more information. Foods to avoid Fruits  Canned fruit in heavy syrup. Fruit in cream or butter sauce. Fried fruit. Vegetables  Vegetables cooked in cheese, cream, or butter sauce. Fried vegetables. Grains  White bread. White pasta. White rice. Cornbread. Bagels, pastries, and croissants. Crackers and snack foods that contain trans fat and hydrogenated oils. Meats and other protein foods  Fatty cuts of meat. Ribs, chicken wings, bacon, sausage, bologna, salami, chitterlings, fatback, hot dogs, bratwurst, and packaged lunch meats. Liver and organ meats. Whole eggs and egg yolks. Chicken and [place] with skin. Fried meat. Dairy  Whole or 2% milk, cream, half-and-half, and cream cheese. Whole milk cheeses. Whole-fat or sweetened yogurt. Full-fat cheeses. Nondairy creamers and whipped toppings. Processed cheese, cheese spreads, and cheese curds. Beverages  Alcohol. Sugar-sweetened drinks such as sodas, lemonade, and fruit drinks. Fats and oils  Butter, stick margarine, lard, shortening, ghee, or  bacon fat. Coconut, palm kernel, and palm oils. Sweets and desserts  Corn syrup, sugars, honey, and molasses. Candy. Jam and jelly. Syrup. Sweetened cereals. Cookies, pies, cakes, donuts, muffins, and ice cream. The items listed above may not be a complete list of foods and beverages you should avoid. Contact a dietitian for more information. Summary  Choosing the right foods helps keep your fat and cholesterol at normal levels. This can keep you from getting certain diseases.  At meals, fill one-half of your plate with vegetables and green salads.  Eat high-fiber foods, like whole grains, beans, apples, carrots, peas, and barley.  Limit added sugar, saturated fats, alcohol, and fried foods. This information is not intended to replace advice given to you by your health care provider. Make sure you discuss any questions you have with your health care provider. Document Revised: 10/15/2017 Document Reviewed: 10/29/2016 Elsevier Patient Education  2020 [employer].

## 2020-02-04 ENCOUNTER — Other Ambulatory Visit: Payer: Self-pay | Admitting: Adult Health

## 2020-02-04 DIAGNOSIS — F419 Anxiety disorder, unspecified: Secondary | ICD-10-CM

## 2020-02-04 NOTE — Telephone Encounter (Signed)
Requested medication (s) are due for refill today: yes  Requested medication (s) are on the active medication list: yes  Last refill:  12/31/19 #30 0 refills  Future visit scheduled: yes  Notes to clinic:  not delegated per protocol     Requested Prescriptions  Pending Prescriptions Disp Refills   ALPRAZolam (XANAX) 0.25 MG tablet [Pharmacy Med Name: ALPRAZOLAM 0.25 MG TABLET] 30 tablet 0    Sig: PLEASE SEE ATTACHED FOR DETAILED DIRECTIONS      Not Delegated - Psychiatry:  Anxiolytics/Hypnotics Failed - 02/04/2020  6:12 PM      Failed - This refill cannot be delegated      Failed - Urine Drug Screen completed in last 360 days      Passed - Valid encounter within last 6 months    Recent Outpatient Visits           4 days ago Lumbosacral strain, initial encounter   [facility] Flinchum, Eula Fried, FNP   1 month ago Anxiety   [facility] Flinchum, Eula Fried, FNP   2 months ago Routine health maintenance   [facility] Flinchum, Eula Fried, FNP   3 years ago Anxiety   [facility] Rudd, Alison Stalling, NP       Future Appointments             In 2 months Flinchum, Eula Fried, FNP [facility]

## 2020-02-16 ENCOUNTER — Ambulatory Visit (INDEPENDENT_AMBULATORY_CARE_PROVIDER_SITE_OTHER): Payer: BC Managed Care – PPO | Admitting: Gastroenterology

## 2020-02-16 ENCOUNTER — Encounter: Payer: Self-pay | Admitting: Gastroenterology

## 2020-02-16 ENCOUNTER — Other Ambulatory Visit: Payer: Self-pay

## 2020-02-16 VITALS — BP 121/82 | HR 91 | Temp 97.6°F | Ht 70.5 in | Wt 199.2 lb

## 2020-02-16 DIAGNOSIS — Z9889 Other specified postprocedural states: Secondary | ICD-10-CM | POA: Diagnosis not present

## 2020-02-16 DIAGNOSIS — K5909 Other constipation: Secondary | ICD-10-CM

## 2020-02-16 DIAGNOSIS — Z1211 Encounter for screening for malignant neoplasm of colon: Secondary | ICD-10-CM

## 2020-02-16 DIAGNOSIS — F419 Anxiety disorder, unspecified: Secondary | ICD-10-CM

## 2020-02-16 DIAGNOSIS — K2270 Barrett's esophagus without dysplasia: Secondary | ICD-10-CM

## 2020-02-16 DIAGNOSIS — Z8719 Personal history of other diseases of the digestive system: Secondary | ICD-10-CM | POA: Diagnosis not present

## 2020-02-16 DIAGNOSIS — K22719 Barrett's esophagus with dysplasia, unspecified: Secondary | ICD-10-CM

## 2020-02-16 MED ORDER — CLENPIQ 10-3.5-12 MG-GM -GM/160ML PO SOLN: 320.0000 mL | Freq: Once | ORAL | 0 refills | Status: AC

## 2020-02-16 MED ORDER — ALPRAZOLAM 0.25 MG PO TABS: 0.2500 mg | ORAL_TABLET | Freq: Every day | ORAL | 0 refills | Status: DC

## 2020-02-16 MED ORDER — METAMUCIL 0.52 G PO CAPS
0.5200 g | ORAL_CAPSULE | Freq: Every day | ORAL | 2 refills | Status: DC
Start: 2020-02-16 — End: 2020-08-07

## 2020-02-16 MED ORDER — POLYETHYLENE GLYCOL 3350 17 GM/SCOOP PO POWD: 17.0000 g | Freq: Two times a day (BID) | ORAL | 2 refills | Status: DC

## 2020-02-16 NOTE — Telephone Encounter (Signed)
Yes that refill request looked strange, I sent in with appropriate sig. Thank you.

## 2020-02-16 NOTE — Progress Notes (Signed)
Arlyss Repress, MD [address]  [address]  [place], [place] [address]  Main: [phone]  Fax: [phone]    Gastroenterology Consultation  Referring Provider:     Stephanie Acre* Primary Care Physician:  Berniece Pap, FNP Primary Gastroenterologist:  Dr. Arlyss Repress Reason for Consultation:     Chronic fatigue, history of small bowel obstruction, chronic constipation        HPI:   Nathaniel Hess is a 49 y.o. male referred by Dr. Berniece Pap, FNP  for consultation & management of chronic constipation, history of small bowel obstruction, history of Barrett's esophagus.  Patient reports that he has been experiencing constipation for several years.  He has not tried any stool softener or fiber supplements.  He reports having irregular, hard bowel movements about 1-2 times a week associated with significant straining.  He denies any rectal bleeding.  His weight has been stable.  TSH normal, no evidence of anemia.  He is also here to discuss about colon cancer screening.  Patient had history of laparoscopic Nissen fundoplication and performed by Dr. Katrinka Blazing in 2007. He was admitted to [facility] in 10/2015 secondary to High-grade small bowel obstruction requiring ex lap and lysis of adhesionsPatient has history of anxiety as well as depression currently being treated for it. Patient has history of Barrett's esophagus without evidence of dysplasia, confirmed based on EGD in 01/2013  Patient does not smoke or drink alcohol He works as a Consulting civil engineer Patient denies any family history of GI malignancy, ulcerative colitis or Crohn's disease  NSAIDs: None  Antiplts/Anticoagulants/Anti thrombotics: None  GI Procedures:  Upper endoscopy 12/20/2009 Diagnosis:  Part A: ESOPHAGUS AT 43 CM COLD BIOPSY:  GLANDULAR MUCOSA WITH INTESTINAL METAPLASIA (BARRETT'S ESOPHAGUS)  ADMIXED WITH BENIGN GASTRIC FUNDIC GLAND MUCOSA. NO DYSPLASIA IS SEEN.  .  Part B:  ESOPHAGUS AT 41 CM COLD BIOPSY:  SQUAMOUS AND GLANDULAR MUCOSA SHOWING INTESTINAL METAPLASIA  (BARRETT'S ESOPHAGUS) WITH CHRONIC INFLAMMATION. NO DYSPLASIA IS SEEN.  .  Part C: ESOPHAGUS AT 39 CM COLD BIOPSY:  GLANDULAR MUCOSA SHOWING INTESTINAL METAPLASIA (BARRETT'S ESOPHAGUS)  WITH CHRONIC INFLAMMATION. NO DYSPLASIA IS SEEN.  .  Part D: ESOPHAGUS AT 37 CM COLD BIOPSY:  GLANDULAR MUCOSA SHOWING INTESTINAL METAPLASIA (BARRETT'S ESOPHAGUS) .  LOW GRADE GLANDULAR DYSPLASIA IS PRESENT.  Upper endoscopy 02/08/2013 Diagnosis:  Part A: ESOPHAGUS AT 41CM COLD BIOPSY:  - GASTRIC TYPE MUCOSA WITH MINIMAL CHRONIC INFLAMMATION.  - NO INTESTINAL METAPLASIA, DYSPLASIA OR MALIGNANCY IDENTIFIED.  Marland Kitchen  Part B: ESOPHAGUS AT 39CM COLD BIOPSY:  - SQUAMOCOLUMNAR MUCOSA WITH INTESTINAL METAPLASIA, CONSISTENT  WITH BARRETTS ESOPHAGUS GIVEN ENDOSCOPIC FINDINGS.  - NEGATIVE FOR DYSPLASIA AND MALIGNANCY.  .  Part C: ESOPHAGUS AT 37CM COLD BIOPSY:  - COLUMNAR MUCOSA WITH INTESTINAL METAPLASIA, CONSISTENT WITH  BARRETTS ESOPHAGUS GIVEN ENDOSCOPIC FINDINGS.  - NEGATIVE FOR DYSPLASIA AND MALIGNANCY.   Past Medical History:  Diagnosis Date  . Anxiety   . Barrett esophagus   . Barrett's esophagus   . Bowel obstruction (HCC)   . Depression   . GERD (gastroesophageal reflux disease)     Past Surgical History:  Procedure Laterality Date  . BOWEL RESECTION N/A 11/08/2015   Procedure: SMALL BOWEL RESECTION Possible;  Surgeon: Lattie Haw, MD;  Location: [facility] ORS;  Service: General;  Laterality: N/A;  . ESOPHAGUS SURGERY    . LAPAROTOMY N/A 11/08/2015   Procedure: EXPLORATORY LAPAROTOMY;  Surgeon: Lattie Haw, MD;  Location: [facility] ORS;  Service: General;  Laterality: N/A;  . NISSEN FUNDOPLICATION      Current Outpatient Medications:  .  ALPRAZolam (XANAX) 0.25 MG tablet, PLEASE SEE ATTACHED FOR DETAILED DIRECTIONS, Disp: 30 tablet, Rfl: 0 .  buPROPion (WELLBUTRIN XL) 300 MG 24 hr tablet, Take 1  tablet (300 mg total) by mouth daily. Take half tablet for first 7 days then increase to 1 tablet daily., Disp: 90 tablet, Rfl: 0 .  cyclobenzaprine (FLEXERIL) 10 MG tablet, Take 1 tablet (10 mg total) by mouth 3 (three) times daily as needed for muscle spasms (will make you drowsy, recomend at bedtime only  if sufficent relief.)., Disp: 30 tablet, Rfl: 0 .  Vitamin D, Ergocalciferol, (DRISDOL) 1.25 MG (50000 UNIT) CAPS capsule, Take 1 capsule (50,000 Units total) by mouth every 7 (seven) days. (taking one tablet per week)check lab the following 1-2 weeks after completing., Disp: 12 capsule, Rfl: 0 .  polyethylene glycol powder (GLYCOLAX/MIRALAX) 17 GM/SCOOP powder, Take 17 g by mouth 2 (two) times daily., Disp: 255 g, Rfl: 2 .  psyllium (METAMUCIL) 0.52 g capsule, Take 1 capsule (0.52 g total) by mouth daily., Disp: 30 capsule, Rfl: 2 .  Sod Picosulfate-Mag Ox-Cit Acd (CLENPIQ) 10-3.5-12 MG-GM -GM/160ML SOLN, Take 320 mLs by mouth once for 1 dose., Disp: 320 mL, Rfl: 0   Family History  Problem Relation Age of Onset  . Depression Mother   . Multiple sclerosis Mother   . COPD Father   . Drug abuse Father   . Heart disease Father   . Colon polyps Father   . Diabetes Sister   . Diabetes Maternal Grandmother   . Stroke Maternal Grandmother   . Stroke Maternal Grandfather   . Heart attack Paternal Grandfather   . Prostate cancer Neg Hx   . Colon cancer Neg Hx      Social History   Tobacco Use  . Smoking status: Never Smoker  . Smokeless tobacco: Never Used  Vaping Use  . Vaping Use: Never used  Substance Use Topics  . Alcohol use: Yes    Alcohol/week: 2.0 standard drinks    Types: 2 Cans of beer per week    Comment: occasional  . Drug use: Yes    Frequency: 3.0 times per week    Types: Marijuana    Allergies as of 02/16/2020  . (No Known Allergies)    Review of Systems:    All systems reviewed and negative except where noted in HPI.   Physical Exam:  BP 121/82 (BP  Location: Left Arm, Patient Position: Sitting, Cuff Size: Normal)   Pulse 91   Temp 97.6 F (36.4 C) (Oral)   Ht 5' 10.5" (1.791 m)   Wt 199 lb 4 oz (90.4 kg)   BMI 28.19 kg/m  No LMP for male patient.  General:   Alert,  Well-developed, well-nourished, pleasant and cooperative in NAD Head:  Normocephalic and atraumatic. Eyes:  Sclera clear, no icterus.   Conjunctiva pink. Ears:  Normal auditory acuity. Nose:  No deformity, discharge, or lesions. Mouth:  No deformity or lesions,oropharynx pink & moist. Neck:  Supple; no masses or thyromegaly. Lungs:  Respirations even and unlabored.  Clear throughout to auscultation.   No wheezes, crackles, or rhonchi. No acute distress. Heart:  Regular rate and rhythm; no murmurs, clicks, rubs, or gallops. Abdomen:  Normal bowel sounds. Soft, non-tender and non-distended without masses, hepatosplenomegaly or hernias noted.  No guarding or rebound tenderness.   Rectal: Not performed Msk:  Symmetrical without  gross deformities. Good, equal movement & strength bilaterally. Pulses:  Normal pulses noted. Extremities:  No clubbing or edema.  No cyanosis. Neurologic:  Alert and oriented x3;  grossly normal neurologically. Skin:  Intact without significant lesions or rashes. No jaundice. Psych:  Alert and cooperative. Normal mood and affect.  Imaging Studies: Reviewed  Assessment and Plan:   Nathaniel Hess is a 49 y.o. pleasant Caucasian male with history of anxiety, depression, history of small bowel obstruction, possibly secondary to internal hernia s/p ex lap and lysis of adhesions in 2017, history of chronic constipation, history of short segment Barrett's esophagus without evidence of dysplasia  Chronic constipation Discussed about high-fiber diet, fiber supplements as well as stool softener daily, information provided Strongly advised to cut back on regular intake of red meat  History of chronic GERD, status post Nissen's  fundoplication Symptoms under control Not on any acid suppressive therapy  Short segment Barrett's without dysplasia Recommend surveillance endoscopy If he has persistent Barrett's, recommend long-term acid suppressive therapy for chemoprevention  Colon cancer screening, overdue Recommend screening colonoscopy, with TI evaluation given history of small bowel obstruction  I have discussed alternative options, risks & benefits,  which include, but are not limited to, bleeding, infection, perforation,respiratory complication & drug reaction.  The patient agrees with this plan & written consent will be obtained.     Follow up in 2 months   Arlyss Repress, MD

## 2020-02-16 NOTE — Patient Instructions (Signed)

## 2020-02-16 NOTE — Telephone Encounter (Signed)
On prescription it states that it was electronically received by pharmacy. I contacted [facility] they said they did not have prescription, I was going to give pharmacist verbal order on the phone but did not understand with your sig meant? It was not clear how often or hoe many to take. KW

## 2020-02-16 NOTE — Telephone Encounter (Signed)
I called [facility] in [place] and spoke with pharmacist who confirmed that they did NOT receive a prescription for Alprazolam that was e prescribed on 02/07/2020. Please re-send. It looks like the instructions for this prescription need to be added also.

## 2020-02-16 NOTE — Telephone Encounter (Signed)
Copied from CRM [id number]. Topic: General - Other >> Feb 16, 2020  3:34 PM Jaquita Rector A wrote: Reason for CRM: Patient wife called in and stated that Rx for ALPRAZolam Prudy Feeler) 0.25 MG tablet that went to the pharmacy on 02/07/20 was not received stated that pharmacy is waiting to hear from [facility]. Please advise patient still waiting on this refill

## 2020-02-24 ENCOUNTER — Other Ambulatory Visit: Payer: BC Managed Care – PPO

## 2020-02-25 ENCOUNTER — Other Ambulatory Visit: Payer: Self-pay

## 2020-02-25 ENCOUNTER — Other Ambulatory Visit
Admission: RE | Admit: 2020-02-25 | Discharge: 2020-02-25 | Disposition: A | Discharge status: Home / Self Care | Payer: BC Managed Care – PPO | Source: Ambulatory Visit | Attending: Gastroenterology | Admitting: Gastroenterology

## 2020-02-25 ENCOUNTER — Other Ambulatory Visit: Payer: Self-pay | Admitting: Adult Health

## 2020-02-25 DIAGNOSIS — K2270 Barrett's esophagus without dysplasia: Secondary | ICD-10-CM | POA: Diagnosis not present

## 2020-02-25 DIAGNOSIS — Z1211 Encounter for screening for malignant neoplasm of colon: Secondary | ICD-10-CM | POA: Diagnosis not present

## 2020-02-25 DIAGNOSIS — Z20822 Contact with and (suspected) exposure to covid-19: Secondary | ICD-10-CM | POA: Insufficient documentation

## 2020-02-25 DIAGNOSIS — Z79899 Other long term (current) drug therapy: Secondary | ICD-10-CM | POA: Diagnosis not present

## 2020-02-25 DIAGNOSIS — Z8371 Family history of colonic polyps: Secondary | ICD-10-CM | POA: Diagnosis not present

## 2020-02-25 DIAGNOSIS — Z01812 Encounter for preprocedural laboratory examination: Secondary | ICD-10-CM | POA: Insufficient documentation

## 2020-02-25 DIAGNOSIS — K2100 Gastro-esophageal reflux disease with esophagitis, without bleeding: Secondary | ICD-10-CM | POA: Diagnosis not present

## 2020-02-25 LAB — SARS CORONAVIRUS 2 (TAT 6-24 HRS): SARS Coronavirus 2: NEGATIVE

## 2020-02-28 ENCOUNTER — Encounter
Admission: RE | Disposition: A | Discharge status: Home / Self Care | Payer: Self-pay | Source: Home / Self Care | Attending: Gastroenterology

## 2020-02-28 ENCOUNTER — Ambulatory Visit
Admission: RE | Admit: 2020-02-28 | Discharge: 2020-02-28 | Disposition: A | Discharge status: Home / Self Care | Payer: BC Managed Care – PPO | Attending: Gastroenterology | Admitting: Gastroenterology

## 2020-02-28 ENCOUNTER — Encounter: Payer: Self-pay | Admitting: Gastroenterology

## 2020-02-28 ENCOUNTER — Ambulatory Visit: Payer: BC Managed Care – PPO | Admitting: Anesthesiology

## 2020-02-28 ENCOUNTER — Other Ambulatory Visit: Payer: Self-pay | Admitting: Adult Health

## 2020-02-28 DIAGNOSIS — K2270 Barrett's esophagus without dysplasia: Secondary | ICD-10-CM | POA: Diagnosis not present

## 2020-02-28 DIAGNOSIS — Z1211 Encounter for screening for malignant neoplasm of colon: Secondary | ICD-10-CM

## 2020-02-28 DIAGNOSIS — Z8371 Family history of colonic polyps: Secondary | ICD-10-CM | POA: Insufficient documentation

## 2020-02-28 DIAGNOSIS — Z20822 Contact with and (suspected) exposure to covid-19: Secondary | ICD-10-CM | POA: Insufficient documentation

## 2020-02-28 DIAGNOSIS — Z79899 Other long term (current) drug therapy: Secondary | ICD-10-CM | POA: Insufficient documentation

## 2020-02-28 DIAGNOSIS — K2100 Gastro-esophageal reflux disease with esophagitis, without bleeding: Secondary | ICD-10-CM | POA: Diagnosis not present

## 2020-02-28 DIAGNOSIS — E559 Vitamin D deficiency, unspecified: Secondary | ICD-10-CM

## 2020-02-28 DIAGNOSIS — K22719 Barrett's esophagus with dysplasia, unspecified: Secondary | ICD-10-CM

## 2020-02-28 HISTORY — PX: COLONOSCOPY WITH PROPOFOL: SHX5780

## 2020-02-28 HISTORY — PX: ESOPHAGOGASTRODUODENOSCOPY (EGD) WITH PROPOFOL: SHX5813

## 2020-02-28 SURGERY — COLONOSCOPY WITH PROPOFOL
Anesthesia: General

## 2020-02-28 MED ORDER — SODIUM CHLORIDE 0.9 % IV SOLN: INTRAVENOUS | Status: DC

## 2020-02-28 MED ORDER — PROPOFOL 500 MG/50ML IV EMUL
INTRAVENOUS | Status: DC | PRN
  Administered 2020-02-28: 175 ug/kg/min via INTRAVENOUS

## 2020-02-28 MED ORDER — PROPOFOL 500 MG/50ML IV EMUL
INTRAVENOUS | Status: AC
  Filled 2020-02-28: qty 50

## 2020-02-28 MED ORDER — PROPOFOL 10 MG/ML IV BOLUS
INTRAVENOUS | Status: DC | PRN
  Administered 2020-02-28: 80 mg via INTRAVENOUS
  Administered 2020-02-28: 30 mg via INTRAVENOUS

## 2020-02-28 MED ORDER — LIDOCAINE HCL (CARDIAC) PF 100 MG/5ML IV SOSY
PREFILLED_SYRINGE | INTRAVENOUS | Status: DC | PRN
  Administered 2020-02-28: 50 mg via INTRAVENOUS

## 2020-02-28 MED ORDER — PROPOFOL 10 MG/ML IV BOLUS
INTRAVENOUS | Status: AC
  Filled 2020-02-28: qty 20

## 2020-02-28 NOTE — Op Note (Signed)
[facility]astroenterology Patient Name: Nathaniel Hess Procedure Date: 02/28/2020 11:48 AM MRN: [id number] Account #: [id number] Date of Birth: 21-Nov-1970 Admit Type: Outpatient Age: 50 Room: [facility] ENDO ROOM 1 Gender: Male Note Status: Finalized Procedure:             Colonoscopy Indications:           Screening for colorectal malignant neoplasm, This is                         the patient's first colonoscopy Providers:             Lin Landsman MD, MD Referring MD:          Kelby Aline. Flinchum (Referring MD) Medicines:             General Anesthesia Complications:         No immediate complications. Estimated blood loss: None. Procedure:             Pre-Anesthesia Assessment:                        - Prior to the procedure, a History and Physical was                         performed, and patient medications and allergies were                         reviewed. The patient is competent. The risks and                         benefits of the procedure and the sedation options and                         risks were discussed with the patient. All questions                         were answered and informed consent was obtained.                         Patient identification and proposed procedure were                         verified by the physician, the nurse, the                         anesthesiologist, the anesthetist and the technician                         in the pre-procedure area in the procedure room in the                         endoscopy suite. Mental Status Examination: alert and                         oriented. Airway Examination: normal oropharyngeal                         airway and neck mobility. Respiratory Examination:  clear to auscultation. CV Examination: normal.                         Prophylactic Antibiotics: The patient does not require                         prophylactic antibiotics. Prior Anticoagulants:  The                         patient has taken no previous anticoagulant or                         antiplatelet agents. ASA Grade Assessment: II - A                         patient with mild systemic disease. After reviewing                         the risks and benefits, the patient was deemed in                         satisfactory condition to undergo the procedure. The                         anesthesia plan was to use general anesthesia.                         Immediately prior to administration of medications,                         the patient was re-assessed for adequacy to receive                         sedatives. The heart rate, respiratory rate, oxygen                         saturations, blood pressure, adequacy of pulmonary                         ventilation, and response to care were monitored                         throughout the procedure. The physical status of the                         patient was re-assessed after the procedure.                        After obtaining informed consent, the colonoscope was                         passed under direct vision. Throughout the procedure,                         the patient's blood pressure, pulse, and oxygen                         saturations were monitored continuously. The  Colonoscope was introduced through the anus and                         advanced to the the terminal ileum, with                         identification of the appendiceal orifice and IC                         valve. The colonoscopy was performed without                         difficulty. The patient tolerated the procedure well.                         The quality of the bowel preparation was evaluated                         using the BBPS [facility] Bowel Preparation Scale) with                         scores of: Right Colon = 3, Transverse Colon = 3 and                         Left Colon = 3 (entire mucosa seen well with no                          residual staining, small fragments of stool or opaque                         liquid). The total BBPS score equals 9. Findings:      The perianal and digital rectal examinations were normal. Pertinent       negatives include normal sphincter tone and no palpable rectal lesions.      The terminal ileum appeared normal.      The entire examined colon appeared normal.      The retroflexed view of the distal rectum and anal verge was normal and       showed no anal or rectal abnormalities. Impression:            - The examined portion of the ileum was normal.                        - The entire examined colon is normal.                        - The distal rectum and anal verge are normal on                         retroflexion view.                        - No specimens collected. Recommendation:        - Discharge patient to home (with escort).                        - Resume previous diet today.                        -  Continue present medications.                        - Repeat colonoscopy in 10 years for screening                         purposes. Procedure Code(s):     --- Professional ---                        H4193, Colorectal cancer screening; colonoscopy on                         individual not meeting criteria for high risk Diagnosis Code(s):     --- Professional ---                        Z12.11, Encounter for screening for malignant neoplasm                         of colon CPT copyright 2019 American Medical Association. All rights reserved. The codes documented in this report are preliminary and upon coder review may  be revised to meet current compliance requirements. Dr. Libby Maw Toney Reil MD, MD 02/28/2020 12:23:32 PM This report has been signed electronically. Number of Addenda: 0 Note Initiated On: 02/28/2020 11:48 AM Scope Withdrawal Time: 0 hours 7 minutes 46 seconds  Total Procedure Duration: 0 hours 10 minutes 22 seconds   Estimated Blood Loss:  Estimated blood loss: none.      [facility]

## 2020-02-28 NOTE — Op Note (Signed)
[facility] Gastroenterology Patient Name: Nathaniel Hess Procedure Date: 02/28/2020 11:48 AM MRN: [id number] Account #: [id number] Date of Birth: 1970/07/13 Admit Type: Outpatient Age: 50 Room: [facility] ENDO ROOM 1 Gender: Male Note Status: Finalized Procedure:             Upper GI endoscopy Indications:           Surveillance for malignancy due to personal history of                         Barrett's esophagus Providers:             Lin Landsman MD, MD Referring MD:          Kelby Aline. Flinchum (Referring MD) Medicines:             General Anesthesia Complications:         No immediate complications. Estimated blood loss: None. Procedure:             Pre-Anesthesia Assessment:                        - Prior to the procedure, a History and Physical was                         performed, and patient medications and allergies were                         reviewed. The patient is competent. The risks and                         benefits of the procedure and the sedation options and                         risks were discussed with the patient. All questions                         were answered and informed consent was obtained.                         Patient identification and proposed procedure were                         verified by the physician, the nurse, the                         anesthesiologist, the anesthetist and the technician                         in the pre-procedure area in the procedure room in the                         endoscopy suite. Mental Status Examination: alert and                         oriented. Airway Examination: normal oropharyngeal                         airway and neck mobility. Respiratory Examination:  clear to auscultation. CV Examination: normal.                         Prophylactic Antibiotics: The patient does not require                         prophylactic antibiotics. Prior Anticoagulants: The                          patient has taken no previous anticoagulant or                         antiplatelet agents. ASA Grade Assessment: II - A                         patient with mild systemic disease. After reviewing                         the risks and benefits, the patient was deemed in                         satisfactory condition to undergo the procedure. The                         anesthesia plan was to use general anesthesia.                         Immediately prior to administration of medications,                         the patient was re-assessed for adequacy to receive                         sedatives. The heart rate, respiratory rate, oxygen                         saturations, blood pressure, adequacy of pulmonary                         ventilation, and response to care were monitored                         throughout the procedure. The physical status of the                         patient was re-assessed after the procedure.                        After obtaining informed consent, the endoscope was                         passed under direct vision. Throughout the procedure,                         the patient's blood pressure, pulse, and oxygen                         saturations were monitored continuously. The Endoscope  was introduced through the mouth, and advanced to the                         second part of duodenum. The upper GI endoscopy was                         accomplished without difficulty. The patient tolerated                         the procedure well. Findings:      The duodenal bulb and second portion of the duodenum were normal.      Evidence of a Nissen fundoplication was found in the gastric fundus. The       wrap appeared intact. This was traversed.      The stomach was normal.      The cardia and gastric fundus were normal on retroflexion.      There were esophageal mucosal changes secondary to established        short-segment Barrett's disease present in the lower third of the       esophagus. The maximum longitudinal extent of these mucosal changes was       1 cm in length. Mucosa was biopsied with a cold forceps for histology.       One specimen bottle was sent to pathology. Estimated blood loss: none.      Esophagogastric landmarks were identified: the gastroesophageal junction       was found at 35 cm from the incisors. Impression:            - Normal duodenal bulb and second portion of the                         duodenum.                        - A Nissen fundoplication was found. The wrap appears                         intact.                        - Normal stomach.                        - Esophageal mucosal changes secondary to established                         short-segment Barrett's disease. Biopsied.                        - Esophagogastric landmarks identified. Recommendation:        - Use Prilosec (omeprazole) 40 mg PO daily                         indefinitely.                        - Follow an antireflux regimen indefinitely.                        - Await pathology results.                        -  Repeat upper endoscopy in 3-5 years for surveillance                         of Barrett's esophagus based on path. Procedure Code(s):     --- Professional ---                        [id number], Esophagogastroduodenoscopy, flexible,                         transoral; with biopsy, single or multiple Diagnosis Code(s):     --- Professional ---                        K22.70, Barrett's esophagus without dysplasia                        Z98.890, Other specified postprocedural states CPT copyright 2019 American Medical Association. All rights reserved. The codes documented in this report are preliminary and upon coder review may  be revised to meet current compliance requirements. Dr. Libby Maw Toney Reil MD, MD 02/28/2020 12:10:02 PM This report has been signed  electronically. Number of Addenda: 0 Note Initiated On: 02/28/2020 11:48 AM Estimated Blood Loss:  Estimated blood loss: none.      [facility]

## 2020-02-28 NOTE — Anesthesia Preprocedure Evaluation (Signed)
Anesthesia Evaluation  Patient identified by MRN, date of birth, ID band Patient awake    Reviewed: Allergy & Precautions, NPO status , Patient's Chart, lab work & pertinent test results  History of Anesthesia Complications Negative for: history of anesthetic complications  Airway Mallampati: II  TM Distance: >3 FB Neck ROM: Full    Dental no notable dental hx. (+) Teeth Intact   Pulmonary neg pulmonary ROS, neg sleep apnea, neg COPD, Patient abstained from smoking.Not current smoker,    Pulmonary exam normal breath sounds clear to auscultation       Cardiovascular Exercise Tolerance: Good METS(-) hypertension(-) CAD and (-) Past MI negative cardio ROS  (-) dysrhythmias  Rhythm:Regular Rate:Normal - Systolic murmurs    Neuro/Psych PSYCHIATRIC DISORDERS Anxiety Depression negative neurological ROS     GI/Hepatic Neg liver ROS, GERD (no problems since Nissen)  ,S/p Nissen   Endo/Other  negative endocrine ROSneg diabetes  Renal/GU negative Renal ROS     Musculoskeletal negative musculoskeletal ROS (+)   Abdominal   Peds  Hematology negative hematology ROS (+)   Anesthesia Other Findings Past Medical History: 01/04/2020: Acute otitis externa of left ear No date: Anxiety No date: Barrett esophagus No date: Barrett's esophagus No date: Bowel obstruction (HCC) No date: Depression No date: GERD (gastroesophageal reflux disease) 01/04/2020: Left otitis media  Reproductive/Obstetrics                             Anesthesia Physical  Anesthesia Plan  ASA: II  Anesthesia Plan: General   Post-op Pain Management:    Induction: Intravenous  PONV Risk Score and Plan: 2 and Ondansetron, Propofol infusion and TIVA  Airway Management Planned: Natural Airway  Additional Equipment: None  Intra-op Plan:   Post-operative Plan:   Informed Consent: I have reviewed the patients History and  Physical, chart, labs and discussed the procedure including the risks, benefits and alternatives for the proposed anesthesia with the patient or authorized representative who has indicated his/her understanding and acceptance.     Dental advisory given  Plan Discussed with: CRNA and Surgeon  Anesthesia Plan Comments: (Discussed risks of anesthesia with patient, including possibility of difficulty with spontaneous ventilation under anesthesia necessitating airway intervention, PONV, and rare risks such as cardiac or respiratory or neurological events. Patient understands.)        Anesthesia Quick Evaluation

## 2020-02-28 NOTE — Telephone Encounter (Signed)
Requested medication (s) are due for refill today: yes  Requested medication (s) are on the active medication list: yes  Last refill:  12/08/19 #12  Future visit scheduled: yes  Notes to clinic:  Please review for refill. Refill not delegated per protocol    Requested Prescriptions  Pending Prescriptions Disp Refills   Vitamin D, Ergocalciferol, (DRISDOL) 1.25 MG (50000 UNIT) CAPS capsule [Pharmacy Med Name: VITAMIN D2 1.25MG (50,000 UNIT)] 12 capsule 0    Sig: PLEASE SEE ATTACHED FOR DETAILED DIRECTIONS      Endocrinology:  Vitamins - Vitamin D Supplementation Failed - 02/28/2020  1:40 AM      Failed - 50,000 IU strengths are not delegated      Failed - Ca in normal range and within 360 days    Calcium  Date Value Ref Range Status  12/01/2019 8.5 (L) 8.7 - 10.2 mg/dL Final   Calcium, Total  Date Value Ref Range Status  01/15/2014 8.5 8.5 - 10.1 mg/dL Final          Failed - Phosphate in normal range and within 360 days    No results found for: PHOS        Failed - Vitamin D in normal range and within 360 days    Vit D, 25-Hydroxy  Date Value Ref Range Status  12/01/2019 15.1 (L) 30.0 - 100.0 ng/mL Final    Comment:    Vitamin D deficiency has been defined by the Institute of Medicine and an Endocrine Society practice guideline as a level of serum 25-OH vitamin D less than 20 ng/mL (1,2). The Endocrine Society went on to further define vitamin D insufficiency as a level between 21 and 29 ng/mL (2). 1. IOM (Institute of Medicine). 2010. Dietary reference    intakes for calcium and D. [place]: The    [employer]. 2. Holick MF, Binkley [place], Bischoff-Ferrari HA, et al.    Evaluation, treatment, and prevention of vitamin D    deficiency: an Endocrine Society clinical practice    guideline. JCEM. 2011 Jul; 96(7):1911-30.           Passed - Valid encounter within last 12 months    Recent Outpatient Visits           4 weeks ago Lumbosacral strain,  initial encounter   [facility] Flinchum, Eula Fried, FNP   1 month ago Anxiety   [facility] Flinchum, Eula Fried, FNP   3 months ago Routine health maintenance   [facility] Flinchum, Eula Fried, FNP   3 years ago Anxiety   [facility] Kyung Rudd, Alison Stalling, NP       Future Appointments             In 1 month Vanga, Loel Dubonnet, MD [facility]   In 2 months Flinchum, Eula Fried, FNP [facility]

## 2020-02-28 NOTE — H&P (Signed)
Arlyss Repress, MD [address]  [address]  [place], [place] [address]  Main: [phone]  Fax: [phone] Pager: [phone]  Primary Care Physician:  Berniece Pap, FNP Primary Gastroenterologist:  Dr. Arlyss Repress  Pre-Procedure History & Physical: HPI:  Nathaniel Hess is a 50 y.o. male is here for an endoscopy and colonoscopy.   Past Medical History:  Diagnosis Date  . Acute otitis externa of left ear 01/04/2020  . Anxiety   . Barrett esophagus   . Barrett's esophagus   . Bowel obstruction (HCC)   . Depression   . GERD (gastroesophageal reflux disease)   . Left otitis media 01/04/2020    Past Surgical History:  Procedure Laterality Date  . BOWEL RESECTION N/A 11/08/2015   Procedure: SMALL BOWEL RESECTION Possible;  Surgeon: Lattie Haw, MD;  Location: [facility] ORS;  Service: General;  Laterality: N/A;  . ESOPHAGUS SURGERY    . LAPAROTOMY N/A 11/08/2015   Procedure: EXPLORATORY LAPAROTOMY;  Surgeon: Lattie Haw, MD;  Location: [facility] ORS;  Service: General;  Laterality: N/A;  . NISSEN FUNDOPLICATION      Prior to Admission medications   Medication Sig Start Date End Date Taking? Authorizing Provider  ALPRAZolam (XANAX) 0.25 MG tablet Take 1 tablet (0.25 mg total) by mouth at bedtime. 02/16/20   Flinchum, Eula Fried, FNP  buPROPion (WELLBUTRIN XL) 300 MG 24 hr tablet Take 1 tablet (300 mg total) by mouth daily. Take half tablet for first 7 days then increase to 1 tablet daily. 12/31/19   Flinchum, Eula Fried, FNP  cyclobenzaprine (FLEXERIL) 10 MG tablet Take 1 tablet (10 mg total) by mouth 3 (three) times daily as needed for muscle spasms (will make you drowsy, recomend at bedtime only  if sufficent relief.). 01/31/20   Flinchum, Eula Fried, FNP  polyethylene glycol powder (GLYCOLAX/MIRALAX) 17 GM/SCOOP powder Take 17 g by mouth 2 (two) times daily. 02/16/20   Toney Reil, MD  psyllium (METAMUCIL) 0.52 g capsule Take 1 capsule (0.52 g total) by  mouth daily. 02/16/20 03/17/20  Toney Reil, MD  Vitamin D, Ergocalciferol, (DRISDOL) 1.25 MG (50000 UNIT) CAPS capsule Take 1 capsule (50,000 Units total) by mouth every 7 (seven) days. (taking one tablet per week)check lab the following 1-2 weeks after completing. 12/08/19   Flinchum, Eula Fried, FNP    Allergies as of 02/16/2020  . (No Known Allergies)    Family History  Problem Relation Age of Onset  . Depression Mother   . Multiple sclerosis Mother   . COPD Father   . Drug abuse Father   . Heart disease Father   . Colon polyps Father   . Diabetes Sister   . Diabetes Maternal Grandmother   . Stroke Maternal Grandmother   . Stroke Maternal Grandfather   . Heart attack Paternal Grandfather   . Prostate cancer Neg Hx   . Colon cancer Neg Hx     Social History   Socioeconomic History  . Marital status: Married    Spouse name: Not on file  . Number of children: Not on file  . Years of education: Not on file  . Highest education level: Not on file  Occupational History  . Not on file  Tobacco Use  . Smoking status: Never Smoker  . Smokeless tobacco: Never Used  Vaping Use  . Vaping Use: Never used  Substance and Sexual Activity  . Alcohol use: Yes    Alcohol/week: 2.0 standard drinks  Types: 2 Cans of beer per week    Comment: occasional  . Drug use: Yes    Frequency: 3.0 times per week    Types: Marijuana  . Sexual activity: Yes  Other Topics Concern  . Not on file  Social History Narrative  . Not on file   Social Determinants of Health   Financial Resource Strain: Not on file  Food Insecurity: Not on file  Transportation Needs: Not on file  Physical Activity: Not on file  Stress: Not on file  Social Connections: Not on file  Intimate Partner Violence: Not on file    Review of Systems: See HPI, otherwise negative ROS  Physical Exam: BP 112/77   Pulse 70   Temp (!) 97 F (36.1 C) (Temporal)   Resp 17   Ht 5\' 10"  (1.778 m)   Wt 93 kg    SpO2 99%   BMI 29.41 kg/m  General:   Alert,  pleasant and cooperative in NAD Head:  Normocephalic and atraumatic. Neck:  Supple; no masses or thyromegaly. Lungs:  Clear throughout to auscultation.    Heart:  Regular rate and rhythm. Abdomen:  Soft, nontender and nondistended. Normal bowel sounds, without guarding, and without rebound.   Neurologic:  Alert and  oriented x4;  grossly normal neurologically.  Impression/Plan: Nathaniel Hess is here for an endoscopy and colonoscopy to be performed for h/o barrett's, colon cancer screening  Risks, benefits, limitations, and alternatives regarding  endoscopy and colonoscopy have been reviewed with the patient.  Questions have been answered.  All parties agreeable.   Jeanella Flattery, MD  02/28/2020, 10:51 AM

## 2020-02-28 NOTE — Anesthesia Postprocedure Evaluation (Signed)
Anesthesia Post Note  Patient: Nathaniel Hess  Procedure(s) Performed: COLONOSCOPY WITH PROPOFOL (N/A ) ESOPHAGOGASTRODUODENOSCOPY (EGD) WITH PROPOFOL (N/A )  Patient location during evaluation: Endoscopy Anesthesia Type: General Level of consciousness: awake and alert Pain management: pain level controlled Vital Signs Assessment: post-procedure vital signs reviewed and stable Respiratory status: spontaneous breathing, nonlabored ventilation, respiratory function stable and patient connected to nasal cannula oxygen Cardiovascular status: blood pressure returned to baseline and stable Postop Assessment: no apparent nausea or vomiting Anesthetic complications: no   No complications documented.   Last Vitals:  Vitals:   02/28/20 1248 02/28/20 1258  BP: 127/90 119/76  Pulse:    Resp:    Temp:    SpO2:      Last Pain:  Vitals:   02/28/20 1258  TempSrc:   PainSc: 0-No pain                 Corinda Gubler

## 2020-02-28 NOTE — Anesthesia Procedure Notes (Signed)
Date/Time: 02/28/2020 11:55 AM Performed by: Ginger Carne, CRNA Pre-anesthesia Checklist: Patient identified, Emergency Drugs available, Suction available, Patient being monitored and Timeout performed Patient Re-evaluated:Patient Re-evaluated prior to induction Oxygen Delivery Method: Nasal cannula Preoxygenation: Pre-oxygenation with 100% oxygen Induction Type: IV induction

## 2020-02-28 NOTE — Transfer of Care (Signed)
Immediate Anesthesia Transfer of Care Note  Patient: Nathaniel Hess  Procedure(s) Performed: COLONOSCOPY WITH PROPOFOL (N/A ) ESOPHAGOGASTRODUODENOSCOPY (EGD) WITH PROPOFOL (N/A )  Patient Location: PACU  Anesthesia Type:General  Level of Consciousness: awake  Airway & Oxygen Therapy: Patient Spontanous Breathing  Post-op Assessment: Report given to RN and Post -op Vital signs reviewed and stable  Post vital signs: Reviewed and stable  Last Vitals:  Vitals Value Taken Time  BP 108/69 02/28/20 1229  Temp 36.1 C 02/28/20 1228  Pulse 71 02/28/20 1229  Resp 19 02/28/20 1229  SpO2 95 % 02/28/20 1229    Last Pain:  Vitals:   02/28/20 1228  TempSrc: Temporal  PainSc: 0-No pain         Complications: No complications documented.

## 2020-02-29 ENCOUNTER — Other Ambulatory Visit: Payer: Self-pay | Admitting: Adult Health

## 2020-02-29 ENCOUNTER — Encounter: Payer: Self-pay | Admitting: Gastroenterology

## 2020-02-29 DIAGNOSIS — E559 Vitamin D deficiency, unspecified: Secondary | ICD-10-CM

## 2020-02-29 LAB — SURGICAL PATHOLOGY

## 2020-03-01 ENCOUNTER — Encounter: Payer: Self-pay | Admitting: Gastroenterology

## 2020-03-06 ENCOUNTER — Other Ambulatory Visit: Payer: Self-pay | Admitting: Gastroenterology

## 2020-03-06 ENCOUNTER — Telehealth: Payer: Self-pay

## 2020-03-06 MED ORDER — OMEPRAZOLE 20 MG PO CPDR: 20.0000 mg | DELAYED_RELEASE_CAPSULE | Freq: Every day | ORAL | 2 refills | Status: DC

## 2020-03-06 NOTE — Telephone Encounter (Signed)
Sent medication to the pharmacy and put in recall for 3 years

## 2020-03-06 NOTE — Telephone Encounter (Signed)
-----   Message from Toney Reil, MD sent at 03/06/2020  8:53 AM EST ----- pathology results reveal barrett's esophagus and without any precancerous changes. I recommend to start taking prilosec 20mg  daily long term for cancer prevention. he will need surveillance endoscopy in 3 years  RV

## 2020-03-23 ENCOUNTER — Other Ambulatory Visit: Payer: Self-pay | Admitting: Adult Health

## 2020-03-23 DIAGNOSIS — F419 Anxiety disorder, unspecified: Secondary | ICD-10-CM

## 2020-04-25 ENCOUNTER — Ambulatory Visit (INDEPENDENT_AMBULATORY_CARE_PROVIDER_SITE_OTHER): Payer: BC Managed Care – PPO | Admitting: Gastroenterology

## 2020-04-25 ENCOUNTER — Other Ambulatory Visit: Payer: Self-pay

## 2020-04-25 ENCOUNTER — Encounter: Payer: Self-pay | Admitting: Gastroenterology

## 2020-04-25 VITALS — BP 122/82 | HR 94 | Temp 97.6°F | Ht 70.5 in | Wt 196.5 lb

## 2020-04-25 DIAGNOSIS — K2270 Barrett's esophagus without dysplasia: Secondary | ICD-10-CM | POA: Diagnosis not present

## 2020-04-25 DIAGNOSIS — K5904 Chronic idiopathic constipation: Secondary | ICD-10-CM | POA: Diagnosis not present

## 2020-04-25 MED ORDER — OMEPRAZOLE 20 MG PO CPDR: 20.0000 mg | DELAYED_RELEASE_CAPSULE | Freq: Every day | ORAL | 3 refills | Status: AC

## 2020-04-25 NOTE — Progress Notes (Signed)
Nathaniel Repress, MD [address]  [address]  [place], [place] [address]  Main: [phone]  Fax: [phone]    Gastroenterology Consultation  Referring Provider:     Stephanie Acre* Primary Care Physician:  Berniece Pap, FNP Primary Gastroenterologist:  Dr. Arlyss Hess Reason for Consultation:     Chronic fatigue, history of small bowel obstruction, chronic constipation        HPI:   Nathaniel Hess is a 50 y.o. male referred by Dr. Berniece Pap, FNP  for consultation & management of chronic constipation, history of small bowel obstruction, history of Barrett's esophagus.  Patient reports that he has been experiencing constipation for several years.  He has not tried any stool softener or fiber supplements.  He reports having irregular, hard bowel movements about 1-2 times a week associated with significant straining.  He denies any rectal bleeding.  His weight has been stable.  TSH normal, no evidence of anemia.  He is also here to discuss about colon cancer screening.  Patient had history of laparoscopic Nissen fundoplication and performed by Dr. Katrinka Blazing in 2007. He was admitted to [facility] in 10/2015 secondary to High-grade small bowel obstruction requiring ex lap and lysis of adhesionsPatient has history of anxiety as well as depression currently being treated for it. Patient has history of Barrett's esophagus without evidence of dysplasia, confirmed based on EGD in 01/2013  Patient does not smoke or drink alcohol He works as a Consulting civil engineer Patient denies any family history of GI malignancy, ulcerative colitis or Crohn's disease  Follow-up visit 04/25/2020 Patient denies any symptoms today.  He reports that he has been taking fiber supplements daily which has improved his bowel movements.  However, he has BM every 2 days.  He is trying to drink more water.  Patient underwent upper endoscopy which revealed short segment Barrett's only.  Colonoscopy was  normal.  NSAIDs: None  Antiplts/Anticoagulants/Anti thrombotics: None  GI Procedures:  Upper endoscopy 12/20/2009 Diagnosis:  Part A: ESOPHAGUS AT 43 CM COLD BIOPSY:  GLANDULAR MUCOSA WITH INTESTINAL METAPLASIA (BARRETT'S ESOPHAGUS)  ADMIXED WITH BENIGN GASTRIC FUNDIC GLAND MUCOSA. NO DYSPLASIA IS SEEN.  .  Part B: ESOPHAGUS AT 41 CM COLD BIOPSY:  SQUAMOUS AND GLANDULAR MUCOSA SHOWING INTESTINAL METAPLASIA  (BARRETT'S ESOPHAGUS) WITH CHRONIC INFLAMMATION. NO DYSPLASIA IS SEEN.  .  Part C: ESOPHAGUS AT 39 CM COLD BIOPSY:  GLANDULAR MUCOSA SHOWING INTESTINAL METAPLASIA (BARRETT'S ESOPHAGUS)  WITH CHRONIC INFLAMMATION. NO DYSPLASIA IS SEEN.  .  Part D: ESOPHAGUS AT 37 CM COLD BIOPSY:  GLANDULAR MUCOSA SHOWING INTESTINAL METAPLASIA (BARRETT'S ESOPHAGUS) .  LOW GRADE GLANDULAR DYSPLASIA IS PRESENT.  Upper endoscopy 02/08/2013 Diagnosis:  Part A: ESOPHAGUS AT 41CM COLD BIOPSY:  - GASTRIC TYPE MUCOSA WITH MINIMAL CHRONIC INFLAMMATION.  - NO INTESTINAL METAPLASIA, DYSPLASIA OR MALIGNANCY IDENTIFIED.  Marland Kitchen  Part B: ESOPHAGUS AT 39CM COLD BIOPSY:  - SQUAMOCOLUMNAR MUCOSA WITH INTESTINAL METAPLASIA, CONSISTENT  WITH BARRETTS ESOPHAGUS GIVEN ENDOSCOPIC FINDINGS.  - NEGATIVE FOR DYSPLASIA AND MALIGNANCY.  .  Part C: ESOPHAGUS AT 37CM COLD BIOPSY:  - COLUMNAR MUCOSA WITH INTESTINAL METAPLASIA, CONSISTENT WITH  BARRETTS ESOPHAGUS GIVEN ENDOSCOPIC FINDINGS.  - NEGATIVE FOR DYSPLASIA AND MALIGNANCY.   EGD and colonoscopy 02/28/2020 - Normal duodenal bulb and second portion of the duodenum. - A Nissen fundoplication was found. The wrap appears intact. - Normal stomach. - Esophageal mucosal changes secondary to established short-segment Barrett's disease. Biopsied. - Esophagogastric landmarks identified.  - The examined  portion of the ileum was normal. - The entire examined colon is normal. - The distal rectum and anal verge are normal on retroflexion view. - No specimens  collected. DIAGNOSIS:  A. ESOPHAGUS, 35 CM; COLD BIOPSY:  - COLUMNAR MUCOSA WITH INTESTINAL METAPLASIA.  - SQUAMOUS MUCOSA WITH FEATURES OF MILD REFLUX ESOPHAGITIS.  - NEGATIVE FOR DYSPLASIA AND MALIGNANCY.   Comment:  The biopsy shows gastric type mucosa with scattered goblet cells. The  diagnosis in this case depends on the location of this biopsy. If this  biopsy was taken from the tubular esophagus at least 1 cm above the  gastric fold it shows Barrett's mucosa of the distinctive type. If this  biopsy was taken from the gastric cardia it shows intestinal metaplasia  of the gastric cardia.   Past Medical History:  Diagnosis Date  . Acute otitis externa of left ear 01/04/2020  . Anxiety   . Barrett esophagus   . Barrett's esophagus   . Bowel obstruction (HCC)   . Depression   . GERD (gastroesophageal reflux disease)   . Left otitis media 01/04/2020    Past Surgical History:  Procedure Laterality Date  . BOWEL RESECTION N/A 11/08/2015   Procedure: SMALL BOWEL RESECTION Possible;  Surgeon: Lattie Haw, MD;  Location: [facility] ORS;  Service: General;  Laterality: N/A;  . COLONOSCOPY WITH PROPOFOL N/A 02/28/2020   Procedure: COLONOSCOPY WITH PROPOFOL;  Surgeon: Toney Reil, MD;  Location: [facility] ENDOSCOPY;  Service: Gastroenterology;  Laterality: N/A;  NO C-19 TEST AS OF 1/230/2021 AT 1:45 PM  . ESOPHAGOGASTRODUODENOSCOPY (EGD) WITH PROPOFOL N/A 02/28/2020   Procedure: ESOPHAGOGASTRODUODENOSCOPY (EGD) WITH PROPOFOL;  Surgeon: Toney Reil, MD;  Location: [facility] ENDOSCOPY;  Service: Gastroenterology;  Laterality: N/A;  . ESOPHAGUS SURGERY    . LAPAROTOMY N/A 11/08/2015   Procedure: EXPLORATORY LAPAROTOMY;  Surgeon: Lattie Haw, MD;  Location: [facility] ORS;  Service: General;  Laterality: N/A;  . NISSEN FUNDOPLICATION      Current Outpatient Medications:  .  ALPRAZolam (XANAX) 0.25 MG tablet, Take 1 tablet (0.25 mg total) by mouth at bedtime., Disp: 90 tablet, Rfl: 0 .   buPROPion (WELLBUTRIN XL) 300 MG 24 hr tablet, TAKE 1 TABLET BY MOUTH DAILY. TAKE HALF TABLET FOR FIRST 7 DAYS THEN INCREASE TO 1 TABLET DAILY., Disp: 90 tablet, Rfl: 1 .  cyclobenzaprine (FLEXERIL) 10 MG tablet, Take 1 tablet (10 mg total) by mouth 3 (three) times daily as needed for muscle spasms (will make you drowsy, recomend at bedtime only  if sufficent relief.)., Disp: 30 tablet, Rfl: 0 .  polyethylene glycol powder (GLYCOLAX/MIRALAX) 17 GM/SCOOP powder, Take 17 g by mouth 2 (two) times daily., Disp: 255 g, Rfl: 2 .  Vitamin D, Ergocalciferol, (DRISDOL) 1.25 MG (50000 UNIT) CAPS capsule, Take 1 capsule (50,000 Units total) by mouth every 7 (seven) days., Disp: 12 capsule, Rfl: 0 .  omeprazole (PRILOSEC) 20 MG capsule, Take 1 capsule (20 mg total) by mouth daily., Disp: 90 capsule, Rfl: 3   Family History  Problem Relation Age of Onset  . Depression Mother   . Multiple sclerosis Mother   . COPD Father   . Drug abuse Father   . Heart disease Father   . Colon polyps Father   . Diabetes Sister   . Diabetes Maternal Grandmother   . Stroke Maternal Grandmother   . Stroke Maternal Grandfather   . Heart attack Paternal Grandfather   . Prostate cancer Neg Hx   . Colon cancer Neg  Hx      Social History   Tobacco Use  . Smoking status: Never Smoker  . Smokeless tobacco: Never Used  Vaping Use  . Vaping Use: Never used  Substance Use Topics  . Alcohol use: Yes    Alcohol/week: 2.0 standard drinks    Types: 2 Cans of beer per week    Comment: occasional  . Drug use: Yes    Frequency: 3.0 times per week    Types: Marijuana    Allergies as of 04/25/2020  . (No Known Allergies)    Review of Systems:    All systems reviewed and negative except where noted in HPI.   Physical Exam:  BP 122/82 (BP Location: Left Arm, Patient Position: Sitting, Cuff Size: Normal)   Pulse 94   Temp 97.6 F (36.4 C) (Oral)   Ht 5' 10.5" (1.791 m)   Wt 196 lb 8 oz (89.1 kg)   BMI 27.80 kg/m   No LMP for male patient.  General:   Alert,  Well-developed, well-nourished, pleasant and cooperative in NAD Head:  Normocephalic and atraumatic. Eyes:  Sclera clear, no icterus.   Conjunctiva pink. Ears:  Normal auditory acuity. Nose:  No deformity, discharge, or lesions. Mouth:  No deformity or lesions,oropharynx pink & moist. Neck:  Supple; no masses or thyromegaly. Lungs:  Respirations even and unlabored.  Clear throughout to auscultation.   No wheezes, crackles, or rhonchi. No acute distress. Heart:  Regular rate and rhythm; no murmurs, clicks, rubs, or gallops. Abdomen:  Normal bowel sounds. Soft, non-tender and non-distended without masses, hepatosplenomegaly or hernias noted.  No guarding or rebound tenderness.   Rectal: Not performed Msk:  Symmetrical without gross deformities. Good, equal movement & strength bilaterally. Pulses:  Normal pulses noted. Extremities:  No clubbing or edema.  No cyanosis. Neurologic:  Alert and oriented x3;  grossly normal neurologically. Skin:  Intact without significant lesions or rashes. No jaundice. Psych:  Alert and cooperative. Normal mood and affect.  Imaging Studies: Reviewed  Assessment and Plan:   ANANTH FIALLOS is a 50 y.o. pleasant Caucasian male with history of anxiety, depression, history of small bowel obstruction, possibly secondary to internal hernia s/p ex lap and lysis of adhesions in 2017, history of chronic constipation, history of short segment Barrett's esophagus without evidence of dysplasia  Chronic constipation Reiterated on high-fiber diet, increase fiber supplements to twice daily as well as stool softener daily Strongly advised to cut back on regular intake of red meat  History of chronic GERD, status post Nissen's fundoplication Symptoms under control  Short segment Barrett's without dysplasia Recommend long-term acid suppressive therapy for chemoprevention Start omeprazole 20 mg daily Recommend surveillance  upper endoscopy in 02/2023   Follow up in 2 months   Nathaniel Repress, MD

## 2020-05-02 ENCOUNTER — Ambulatory Visit: Payer: Self-pay | Admitting: Adult Health

## 2020-05-03 ENCOUNTER — Ambulatory Visit: Payer: Self-pay | Admitting: Adult Health

## 2020-05-03 NOTE — Progress Notes (Deleted)
      Established patient visit   Patient: Nathaniel Hess   DOB: 1970-05-23   50 y.o. Male  MRN: [id number] Visit Date: 05/04/2020  Today's healthcare provider: Jairo Ben, FNP   No chief complaint on file.  Subjective    HPI  Anxiety, Follow-up  He was last seen for anxiety 3 months ago. Changes made at last visit include patient was started on Wellbutrin 300mg  and Alprazolam 0.25mg .   He reports {excellent/good/fair/poor:19665} compliance with treatment. He reports {good/fair/poor:18685} tolerance of treatment. He {is/is not:21021397} having side effects. {document side effects if present:1}  He feels his anxiety is {Desc; severity:60313} and {improved/worse/unchanged:3041574} since last visit.  Symptoms: {Yes/No:20286} chest pain {Yes/No:20286} difficulty concentrating  {Yes/No:20286} dizziness {Yes/No:20286} fatigue  {Yes/No:20286} feelings of losing control {Yes/No:20286} insomnia  {Yes/No:20286} irritable {Yes/No:20286} palpitations  {Yes/No:20286} panic attacks {Yes/No:20286} racing thoughts  {Yes/No:20286} shortness of breath {Yes/No:20286} sweating  {Yes/No:20286} tremors/shakes    GAD-7 Results GAD-7 Generalized Anxiety Disorder Screening Tool 09/24/2016  1. Feeling Nervous, Anxious, or on Edge 3  2. Not Being Able to Stop or Control Worrying 3  3. Worrying Too Much About Different Things 3  4. Trouble Relaxing 3  5. Being So Restless it's Hard To Sit Still 0  6. Becoming Easily Annoyed or Irritable 3  7. Feeling Afraid As If Something Awful Might Happen 0  Total GAD-7 Score 15  Difficulty At Work, Home, or Getting  Along With Others? Very difficult    PHQ-9 Scores PHQ9 SCORE ONLY 12/31/2019 11/30/2019 11/30/2019  PHQ-9 Total Score 14 1 0    ---------------------------------------------------------------------------------------------------  {Show patient history (optional):23778::" "}   Medications: Outpatient Medications Prior to Visit   Medication Sig  . ALPRAZolam (XANAX) 0.25 MG tablet Take 1 tablet (0.25 mg total) by mouth at bedtime.  01/30/2020 buPROPion (WELLBUTRIN XL) 300 MG 24 hr tablet TAKE 1 TABLET BY MOUTH DAILY. TAKE HALF TABLET FOR FIRST 7 DAYS THEN INCREASE TO 1 TABLET DAILY.  . cyclobenzaprine (FLEXERIL) 10 MG tablet Take 1 tablet (10 mg total) by mouth 3 (three) times daily as needed for muscle spasms (will make you drowsy, recomend at bedtime only  if sufficent relief.).  Marland Kitchen omeprazole (PRILOSEC) 20 MG capsule Take 1 capsule (20 mg total) by mouth daily.  . polyethylene glycol powder (GLYCOLAX/MIRALAX) 17 GM/SCOOP powder Take 17 g by mouth 2 (two) times daily.  . Vitamin D, Ergocalciferol, (DRISDOL) 1.25 MG (50000 UNIT) CAPS capsule Take 1 capsule (50,000 Units total) by mouth every 7 (seven) days.   No facility-administered medications prior to visit.    Review of Systems  {Labs  Heme  Chem  Endocrine  Serology  Results Review (optional):23779::" "}   Objective    There were no vitals taken for this visit. {Show previous vital signs (optional):23777::" "}   Physical Exam  ***  No results found for any visits on 05/04/20.  Assessment & Plan     ***  No follow-ups on file.      {provider attestation***:1}   07/04/20, FNP  [facility] [phone] (phone) [phone] (fax)  [facility]

## 2020-05-04 ENCOUNTER — Ambulatory Visit: Payer: Self-pay | Admitting: Adult Health

## 2020-05-05 ENCOUNTER — Other Ambulatory Visit: Payer: Self-pay | Admitting: Adult Health

## 2020-05-05 DIAGNOSIS — F419 Anxiety disorder, unspecified: Secondary | ICD-10-CM

## 2020-05-05 NOTE — Telephone Encounter (Signed)
Medication: buPROPion (WELLBUTRIN XL) 300 MG 24 hr tablet [[id number]]   Has the patient contacted their pharmacy? YES (Agent: If no, request that the patient contact the pharmacy for the refill.) (Agent: If yes, when and what did the pharmacy advise?)  Preferred Pharmacy (with phone number or street name): [id number] - [place], [place] - [address] [place] [address] Phone: [phone] Fax: [phone] Hours: Not open 24 hours    Agent: Please be advised that RX refills may take up to 3 business days. We ask that you follow-up with your pharmacy.

## 2020-05-08 ENCOUNTER — Ambulatory Visit (INDEPENDENT_AMBULATORY_CARE_PROVIDER_SITE_OTHER): Payer: Self-pay | Admitting: Adult Health

## 2020-05-08 DIAGNOSIS — Z5329 Procedure and treatment not carried out because of patient's decision for other reasons: Secondary | ICD-10-CM

## 2020-05-08 DIAGNOSIS — Z91199 Patient's noncompliance with other medical treatment and regimen due to unspecified reason: Secondary | ICD-10-CM | POA: Insufficient documentation

## 2020-05-08 NOTE — Progress Notes (Signed)
No show

## 2020-05-21 ENCOUNTER — Other Ambulatory Visit: Payer: Self-pay | Admitting: Adult Health

## 2020-05-21 DIAGNOSIS — E559 Vitamin D deficiency, unspecified: Secondary | ICD-10-CM

## 2020-05-22 NOTE — Telephone Encounter (Signed)
Requested medication (s) are due for refill today: yes  Requested medication (s) are on the active medication list: yes  Last refill: 02/29/20  Future visit scheduled: no  Notes to clinic:  not delegated    Requested Prescriptions  Pending Prescriptions Disp Refills   Vitamin D, Ergocalciferol, (DRISDOL) 1.25 MG (50000 UNIT) CAPS capsule [Pharmacy Med Name: VITAMIN D2 1.25MG (50,000 UNIT)] 12 capsule 0    Sig: Take 1 capsule (50,000 Units total) by mouth every 7 (seven) days.      Endocrinology:  Vitamins - Vitamin D Supplementation Failed - 05/21/2020  9:20 AM      Failed - 50,000 IU strengths are not delegated      Failed - Ca in normal range and within 360 days    Calcium  Date Value Ref Range Status  12/01/2019 8.5 (L) 8.7 - 10.2 mg/dL Final   Calcium, Total  Date Value Ref Range Status  01/15/2014 8.5 8.5 - 10.1 mg/dL Final          Failed - Phosphate in normal range and within 360 days    No results found for: PHOS        Failed - Vitamin D in normal range and within 360 days    Vit D, 25-Hydroxy  Date Value Ref Range Status  12/01/2019 15.1 (L) 30.0 - 100.0 ng/mL Final    Comment:    Vitamin D deficiency has been defined by the Institute of Medicine and an Endocrine Society practice guideline as a level of serum 25-OH vitamin D less than 20 ng/mL (1,2). The Endocrine Society went on to further define vitamin D insufficiency as a level between 21 and 29 ng/mL (2). 1. IOM (Institute of Medicine). 2010. Dietary reference    intakes for calcium and D. [place]: The    [employer]. 2. Holick MF, Binkley Blawnox, Bischoff-Ferrari HA, et al.    Evaluation, treatment, and prevention of vitamin D    deficiency: an Endocrine Society clinical practice    guideline. JCEM. 2011 Jul; 96(7):1911-30.           Passed - Valid encounter within last 12 months    Recent Outpatient Visits           2 weeks ago No-show for appointment   [facility] Flinchum, Eula Fried, FNP   3 months ago Lumbosacral strain, initial encounter   [facility] Flinchum, Eula Fried, FNP   4 months ago Anxiety   [facility] Flinchum, Eula Fried, FNP   5 months ago Routine health maintenance   [facility] Flinchum, Eula Fried, FNP   3 years ago Anxiety   [facility] Kyung Rudd, Alison Stalling, NP

## 2020-05-22 NOTE — Telephone Encounter (Signed)
?   Time to repeat level

## 2020-05-23 NOTE — Telephone Encounter (Signed)
Yes needs repeat vitamin D in lab prior to any additional vitamin D refills.

## 2020-06-26 DIAGNOSIS — R0600 Dyspnea, unspecified: Secondary | ICD-10-CM | POA: Diagnosis not present

## 2020-06-26 DIAGNOSIS — R42 Dizziness and giddiness: Secondary | ICD-10-CM | POA: Diagnosis not present

## 2020-06-26 DIAGNOSIS — R519 Headache, unspecified: Secondary | ICD-10-CM | POA: Diagnosis not present

## 2020-06-26 DIAGNOSIS — R251 Tremor, unspecified: Secondary | ICD-10-CM | POA: Diagnosis not present

## 2020-06-26 DIAGNOSIS — R531 Weakness: Secondary | ICD-10-CM | POA: Diagnosis not present

## 2020-06-26 DIAGNOSIS — K2270 Barrett's esophagus without dysplasia: Secondary | ICD-10-CM | POA: Diagnosis not present

## 2020-06-27 ENCOUNTER — Encounter: Payer: Self-pay | Admitting: Adult Health

## 2020-06-27 ENCOUNTER — Ambulatory Visit (INDEPENDENT_AMBULATORY_CARE_PROVIDER_SITE_OTHER): Payer: BC Managed Care – PPO | Admitting: Adult Health

## 2020-06-27 ENCOUNTER — Other Ambulatory Visit: Payer: Self-pay | Admitting: Adult Health

## 2020-06-27 ENCOUNTER — Other Ambulatory Visit: Payer: Self-pay

## 2020-06-27 VITALS — BP 98/62 | HR 80 | Temp 97.2°F | Wt 185.4 lb

## 2020-06-27 DIAGNOSIS — K5909 Other constipation: Secondary | ICD-10-CM | POA: Diagnosis not present

## 2020-06-27 DIAGNOSIS — E559 Vitamin D deficiency, unspecified: Secondary | ICD-10-CM

## 2020-06-27 DIAGNOSIS — E162 Hypoglycemia, unspecified: Secondary | ICD-10-CM

## 2020-06-27 DIAGNOSIS — Z87898 Personal history of other specified conditions: Secondary | ICD-10-CM

## 2020-06-27 DIAGNOSIS — K5900 Constipation, unspecified: Secondary | ICD-10-CM

## 2020-06-27 LAB — MAGNESIUM: Magnesium: 2.2 mg/dL (ref 1.5–2.5)

## 2020-06-27 LAB — CBC WITH DIFFERENTIAL/PLATELET
Basophils Absolute: 0 10*3/uL (ref 0.0–0.1)
Basophils Relative: 1.5 % (ref 0.0–3.0)
Eosinophils Absolute: 0.2 10*3/uL (ref 0.0–0.7)
Eosinophils Relative: 7.9 % — ABNORMAL HIGH (ref 0.0–5.0)
HCT: 45.4 % (ref 39.0–52.0)
Hemoglobin: 15.1 g/dL (ref 13.0–17.0)
Lymphocytes Relative: 33.8 % (ref 12.0–46.0)
Lymphs Abs: 0.8 10*3/uL (ref 0.7–4.0)
MCHC: 33.3 g/dL (ref 30.0–36.0)
MCV: 87.7 fl (ref 78.0–100.0)
Monocytes Absolute: 0.5 10*3/uL (ref 0.1–1.0)
Monocytes Relative: 20.4 % — ABNORMAL HIGH (ref 3.0–12.0)
Neutro Abs: 0.9 10*3/uL — ABNORMAL LOW (ref 1.4–7.7)
Neutrophils Relative %: 36.4 % — ABNORMAL LOW (ref 43.0–77.0)
Platelets: 256 10*3/uL (ref 150.0–400.0)
RBC: 5.18 Mil/uL (ref 4.22–5.81)
RDW: 13.8 % (ref 11.5–15.5)
WBC: 2.4 10*3/uL — ABNORMAL LOW (ref 4.0–10.5)

## 2020-06-27 LAB — COMPREHENSIVE METABOLIC PANEL
ALT: 15 U/L (ref 0–53)
AST: 20 U/L (ref 0–37)
Albumin: 4.1 g/dL (ref 3.5–5.2)
Alkaline Phosphatase: 60 U/L (ref 39–117)
BUN: 6 mg/dL (ref 6–23)
CO2: 28 mEq/L (ref 19–32)
Calcium: 9.2 mg/dL (ref 8.4–10.5)
Chloride: 107 mEq/L (ref 96–112)
Creatinine, Ser: 1.12 mg/dL (ref 0.40–1.50)
GFR: 77.19 mL/min (ref >60.00)
Glucose, Bld: 90 mg/dL (ref 70–99)
Potassium: 4.3 mEq/L (ref 3.5–5.1)
Sodium: 142 mEq/L (ref 135–145)
Total Bilirubin: 0.5 mg/dL (ref 0.2–1.2)
Total Protein: 6.1 g/dL (ref 6.0–8.3)

## 2020-06-27 LAB — LIPASE: Lipase: 24 U/L (ref 11.0–59.0)

## 2020-06-27 LAB — TSH: TSH: 1.24 u[IU]/mL (ref 0.35–4.50)

## 2020-06-27 LAB — VITAMIN D 25 HYDROXY (VIT D DEFICIENCY, FRACTURES): VITD: 14.67 ng/mL — ABNORMAL LOW (ref 30.00–100.00)

## 2020-06-27 LAB — HEMOGLOBIN A1C: Hgb A1c MFr Bld: 5.4 % (ref 4.6–6.5)

## 2020-06-27 LAB — AMYLASE: Amylase: 48 U/L (ref 27–131)

## 2020-06-27 MED ORDER — BLOOD GLUCOSE METER KIT: PACK | 0 refills | Status: DC

## 2020-06-27 MED ORDER — POLYETHYLENE GLYCOL 3350 17 GM/SCOOP PO POWD: 17.0000 g | Freq: Two times a day (BID) | ORAL | 2 refills | Status: AC

## 2020-06-27 NOTE — Patient Instructions (Signed)
Dizziness Dizziness is a common problem. It makes you feel unsteady or light-headed. You may feel like you are about to pass out (faint). Dizziness can lead to getting hurt if you stumble or fall. Dizziness can be caused by many things, including:  Medicines.  Not having enough water in your body (dehydration).  Illness. Follow these instructions at home: Eating and drinking  Drink enough fluid to keep your pee (urine) clear or pale yellow. This helps to keep you from getting dehydrated. Try to drink more clear fluids, such as water.  Do not drink alcohol.  Limit how much caffeine you drink or eat, if your doctor tells you to do that.  Limit how much salt (sodium) you drink or eat, if your doctor tells you to do that.   Activity  Avoid making quick movements. ? When you stand up from sitting in a chair, steady yourself until you feel okay. ? In the morning, first sit up on the side of the bed. When you feel okay, stand slowly while you hold onto something. Do this until you know that your balance is fine.  If you need to stand in one place for a long time, move your legs often. Tighten and relax the muscles in your legs while you are standing.  Do not drive or use heavy machinery if you feel dizzy.  Avoid bending down if you feel dizzy. Place items in your home so you can reach them easily without leaning over.   Lifestyle  Do not use any products that contain nicotine or tobacco, such as cigarettes and e-cigarettes. If you need help quitting, ask your doctor.  Try to lower your stress level. You can do this by using methods such as yoga or meditation. Talk with your doctor if you need help. General instructions  Watch your dizziness for any changes.  Take over-the-counter and prescription medicines only as told by your doctor. Talk with your doctor if you think that you are dizzy because of a medicine that you are taking.  Tell a friend or a family member that you are feeling  dizzy. If he or she notices any changes in your behavior, have this person call your doctor.  Keep all follow-up visits as told by your doctor. This is important. Contact a doctor if:  Your dizziness does not go away.  Your dizziness or light-headedness gets worse.  You feel sick to your stomach (nauseous).  You have trouble hearing.  You have new symptoms.  You are unsteady on your feet.  You feel like the room is spinning. Get help right away if:  You throw up (vomit) or have watery poop (diarrhea), and you cannot eat or drink anything.  You have trouble: ? Talking. ? Walking. ? Swallowing. ? Using your arms, hands, or legs.  You feel generally weak.  You are not thinking clearly, or you have trouble forming sentences. A friend or family member may notice this.  You have: ? Chest pain. ? Pain in your belly (abdomen). ? Shortness of breath. ? Sweating.  Your vision changes.  You are bleeding.  You have a very bad headache.  You have neck pain or a stiff neck.  You have a fever. These symptoms may be an emergency. Do not wait to see if the symptoms will go away. Get medical help right away. Call your local emergency services (911 in the [place] Do not drive yourself to the hospital. Summary  Dizziness makes you feel unsteady or   light-headed. You may feel like you are about to pass out (faint).  Drink enough fluid to keep your pee (urine) clear or pale yellow. Do not drink alcohol.  Avoid making quick movements if you feel dizzy.  Watch your dizziness for any changes. This information is not intended to replace advice given to you by your health care provider. Make sure you discuss any questions you have with your health care provider. Document Revised: 11/03/2019 Document Reviewed: 02/29/2016 Elsevier Patient Education  2021 [employer] Hemoglobin A1C Test Why am I having this test? You may have the hemoglobin A1C test (A1C test) done to:  Evaluate  your risk for developing diabetes (diabetes mellitus).  Diagnose diabetes.  Monitor long-term control of blood sugar (glucose) in people who have diabetes and help make treatment decisions. This test may be done with other blood glucose tests, such as fasting blood glucose and oral glucose tolerance tests. What is being tested? Hemoglobin is a type of protein in the blood that carries oxygen. Glucose attaches to hemoglobin to form glycated hemoglobin. This test checks the amount of glycated hemoglobin in your blood, which is a good indicator of the average amount of glucose in your blood during the past 2-3 months. What kind of sample is taken? A blood sample is required for this test. It is usually collected by inserting a needle into a blood vessel.   Tell a health care provider about:  All medicines you are taking, including vitamins, herbs, eye drops, creams, and over-the-counter medicines.  Any blood disorders you have.  Any surgeries you have had.  Any medical conditions you have.  Whether you are pregnant or may be pregnant. How are the results reported? Your results will be reported as a percentage that indicates how much of your hemoglobin has glucose attached to it (is glycated). Your health care provider will compare your results to normal ranges that were established after testing a large group of people (reference ranges). Reference ranges may vary among labs and hospitals. For this test, common reference ranges are:  Adult or child without diabetes: 4-5.6%.  Adult or child with diabetes and good blood glucose control: less than 7%. What do the results mean? If you have diabetes:  A result of less than 7% is considered normal, meaning that your blood glucose is well controlled.  A result higher than 7% means that your blood glucose is not well controlled, and your treatment plan may need to be adjusted. If you do not have diabetes:  A result within the reference range  is considered normal, meaning that you are not at high risk for diabetes.  A result of 5.7-6.4% means that you have a high risk of developing diabetes, and you have prediabetes. Prediabetes is the condition of having a blood glucose level that is higher than it should be but not high enough for you to be diagnosed with diabetes. Having prediabetes puts you at risk for developing type 2 diabetes. You may have more tests, including a repeat A1C test.  Results of 6.5% or higher on two separate A1C tests mean that you have diabetes. You may have more tests to confirm the diagnosis. Abnormally low A1C values may be caused by:  Pregnancy.  Severe blood loss.  Receiving donated blood (transfusions).  Low red blood cell count (anemia).  Long-term kidney failure.  Some unusual forms (variants) of hemoglobin. Talk with your health care provider about what your results mean. Questions to ask your health care provider  Ask your health care provider, or the department that is doing the test:  When will my results be ready?  How will I get my results?  What are my treatment options?  What other tests do I need?  What are my next steps? Summary  The A1C test may be done to evaluate your risk for developing diabetes, to diagnose diabetes, and to monitor long-term control of blood sugar (glucose) in people who have diabetes and help make treatment decisions.  Hemoglobin is a type of protein in the blood that carries oxygen. Glucose attaches to hemoglobin to form glycated hemoglobin. This test checks the amount of glycated hemoglobin in your blood, which is a good indicator of the average amount of glucose in your blood during the past 2-3 months.  Talk with your health care provider about what your results mean. This information is not intended to replace advice given to you by your health care provider. Make sure you discuss any questions you have with your health care provider. Document  Revised: 11/10/2019 Document Reviewed: 11/10/2019 Elsevier Patient Education  2021 [employer].

## 2020-06-27 NOTE — Progress Notes (Signed)
Established Patient Office Visit  Subjective:  Patient ID: Nathaniel Hess, male    DOB: 02-01-1971  Age: 50 y.o. MRN: [id number]  CC:  Chief Complaint  Patient presents with  . Dizziness    Pt c/o dizziness. Pt was seen at [facility] ER on 06/26/2020 for dizziness. Told of a mass in Pancreas and possible low blood sugar. Pt is fasting.  Pt does not have glucose meter  . Weight Loss    Pt has lost 25 pounds in a month unintentionally     HPI Nathaniel Hess presents for follow up to the emergency room visit 06/26/20 labs were reassuring. Patient reports 25 lbs weight loss our scaled show a 11 lb weight loss since march.  He is on Wellbutrin.  He has some constipation, denies any rectal pressure or bleeding/ pain.  He reports he has has an episode like yesterdays prior to this and while he was at work he ate a pack of crackers and almost immediately felt better. '  Denies any falls or trauma.   Patient  denies any fever, body aches,chills, rash, chest pain, shortness of breath, nausea, vomiting, or diarrhea.  Denies dizziness or  Lightheadedness today , pre syncopal or syncopal episodes today or in past.    Past Medical History:  Diagnosis Date  . Acute otitis externa of left ear 01/04/2020  . Anxiety   . Barrett esophagus   . Barrett's esophagus   . Bowel obstruction ([place])   . Depression   . GERD (gastroesophageal reflux disease)   . Left otitis media 01/04/2020    Past Surgical History:  Procedure Laterality Date  . BOWEL RESECTION N/A 11/08/2015   Procedure: SMALL BOWEL RESECTION Possible;  Surgeon: Florene Glen, MD;  Location: [facility] ORS;  Service: General;  Laterality: N/A;  . COLONOSCOPY WITH PROPOFOL N/A 02/28/2020   Procedure: COLONOSCOPY WITH PROPOFOL;  Surgeon: Lin Landsman, MD;  Location: [facility] ENDOSCOPY;  Service: Gastroenterology;  Laterality: N/A;  NO C-19 TEST AS OF 1/230/2021 AT 1:45 PM  . ESOPHAGOGASTRODUODENOSCOPY (EGD) WITH PROPOFOL N/A 02/28/2020    Procedure: ESOPHAGOGASTRODUODENOSCOPY (EGD) WITH PROPOFOL;  Surgeon: Lin Landsman, MD;  Location: [place];  Service: Gastroenterology;  Laterality: N/A;  . ESOPHAGUS SURGERY    . LAPAROTOMY N/A 11/08/2015   Procedure: EXPLORATORY LAPAROTOMY;  Surgeon: Florene Glen, MD;  Location: [facility] ORS;  Service: General;  Laterality: N/A;  . NISSEN FUNDOPLICATION      Family History  Problem Relation Age of Onset  . Depression Mother   . Multiple sclerosis Mother   . COPD Father   . Drug abuse Father   . Heart disease Father   . Colon polyps Father   . Diabetes Sister   . Diabetes Maternal Grandmother   . Stroke Maternal Grandmother   . Stroke Maternal Grandfather   . Heart attack Paternal Grandfather   . Prostate cancer Neg Hx   . Colon cancer Neg Hx     Social History   Socioeconomic History  . Marital status: Married    Spouse name: Not on file  . Number of children: Not on file  . Years of education: Not on file  . Highest education level: Not on file  Occupational History  . Not on file  Tobacco Use  . Smoking status: Never Smoker  . Smokeless tobacco: Never Used  Vaping Use  . Vaping Use: Never used  Substance and Sexual Activity  . Alcohol use: Yes  Alcohol/week: 2.0 standard drinks    Types: 2 Cans of beer per week    Comment: occasional  . Drug use: Yes    Frequency: 3.0 times per week    Types: Marijuana  . Sexual activity: Yes  Other Topics Concern  . Not on file  Social History Narrative  . Not on file   Social Determinants of Health   Financial Resource Strain: Not on file  Food Insecurity: Not on file  Transportation Needs: Not on file  Physical Activity: Not on file  Stress: Not on file  Social Connections: Not on file  Intimate Partner Violence: Not on file    Outpatient Medications Prior to Visit  Medication Sig Dispense Refill  . ALPRAZolam (XANAX) 0.25 MG tablet Take 1 tablet (0.25 mg total) by mouth at bedtime. 90 tablet 0   . buPROPion (WELLBUTRIN XL) 300 MG 24 hr tablet TAKE 1 TABLET BY MOUTH DAILY. TAKE HALF TABLET FOR FIRST 7 DAYS THEN INCREASE TO 1 TABLET DAILY. 90 tablet 1  . cyclobenzaprine (FLEXERIL) 10 MG tablet Take 1 tablet (10 mg total) by mouth 3 (three) times daily as needed for muscle spasms (will make you drowsy, recomend at bedtime only  if sufficent relief.). 30 tablet 0  . Vitamin D, Ergocalciferol, (DRISDOL) 1.25 MG (50000 UNIT) CAPS capsule Take 1 capsule (50,000 Units total) by mouth every 7 (seven) days. 12 capsule 0  . polyethylene glycol powder (GLYCOLAX/MIRALAX) 17 GM/SCOOP powder Take 17 g by mouth 2 (two) times daily. 255 g 2  . omeprazole (PRILOSEC) 20 MG capsule Take 1 capsule (20 mg total) by mouth daily. 90 capsule 3   No facility-administered medications prior to visit.    No Known Allergies  ROS Review of Systems  Constitutional: Positive for unexpected weight change (not dieting lost 11 lbs since march. ). Negative for activity change, appetite change, chills, diaphoresis, fatigue and fever.  HENT: Negative.   Respiratory: Negative.   Cardiovascular: Negative.   Gastrointestinal: Negative.   Genitourinary: Negative.   Musculoskeletal: Negative.   Neurological: Positive for dizziness and light-headedness. Negative for tremors, seizures, syncope, facial asymmetry, speech difficulty, weakness, numbness and headaches.  Hematological: Negative for adenopathy. Does not bruise/bleed easily.  Psychiatric/Behavioral: Negative.       Objective:    Physical Exam Constitutional:      General: He is not in acute distress.    Appearance: Normal appearance. He is well-developed. He is not ill-appearing, toxic-appearing or diaphoretic.     Comments: Patient is alert and oriented and responsive to questions Engages in eye contact with provider. Speaks in full sentences without any pauses without any shortness of breath or distress.    HENT:     Head: Normocephalic and atraumatic.      Right Ear: Hearing, tympanic membrane, ear canal and external ear normal. There is no impacted cerumen.     Left Ear: Hearing, tympanic membrane, ear canal and external ear normal. There is no impacted cerumen.     Nose: Rhinorrhea present.     Mouth/Throat:     Pharynx: Uvula midline. No oropharyngeal exudate or posterior oropharyngeal erythema.  Eyes:     General: Lids are normal. No scleral icterus.       Right eye: No discharge.        Left eye: No discharge.     Conjunctiva/sclera: Conjunctivae normal.     Pupils: Pupils are equal, round, and reactive to light.  Neck:     Thyroid: No thyromegaly.  Vascular: Normal carotid pulses. No carotid bruit, hepatojugular reflux or JVD.     Trachea: Trachea and phonation normal. No tracheal tenderness or tracheal deviation.     Meningeal: Brudzinski's sign absent.  Cardiovascular:     Rate and Rhythm: Normal rate and regular rhythm.     Pulses: Normal pulses.     Heart sounds: Normal heart sounds, S1 normal and S2 normal. Heart sounds not distant. No murmur heard. No friction rub. No gallop.   Pulmonary:     Effort: Pulmonary effort is normal. No accessory muscle usage or respiratory distress.     Breath sounds: Normal breath sounds. No stridor. No wheezing, rhonchi or rales.  Chest:     Chest wall: No tenderness.  Abdominal:     General: Bowel sounds are normal. There is no distension.     Palpations: Abdomen is soft. There is no mass.     Tenderness: There is no abdominal tenderness. There is no right CVA tenderness, left CVA tenderness, guarding or rebound.     Hernia: No hernia is present.  Musculoskeletal:        General: No tenderness or deformity. Normal range of motion.     Cervical back: Full passive range of motion without pain, normal range of motion and neck supple.  Lymphadenopathy:     Head:     Right side of head: No submental, submandibular, tonsillar, preauricular, posterior auricular or occipital adenopathy.      Left side of head: No submental, submandibular, tonsillar, preauricular, posterior auricular or occipital adenopathy.     Cervical: No cervical adenopathy.  Skin:    General: Skin is warm and dry.     Coloration: Skin is not pale.     Findings: No erythema or rash.     Nails: There is no clubbing.  Neurological:     General: No focal deficit present.     Mental Status: He is alert and oriented to person, place, and time.     GCS: GCS eye subscore is 4. GCS verbal subscore is 5. GCS motor subscore is 6.     Cranial Nerves: No cranial nerve deficit.     Sensory: No sensory deficit.     Motor: No weakness or abnormal muscle tone.     Coordination: Coordination normal.     Gait: Gait normal.     Deep Tendon Reflexes: Reflexes are normal and symmetric. Reflexes normal.  Psychiatric:        Mood and Affect: Mood normal.        Speech: Speech normal.        Behavior: Behavior normal.        Thought Content: Thought content normal.        Judgment: Judgment normal.     BP 98/62 (BP Location: Left Arm, Patient Position: Sitting)   Pulse 80   Temp (!) 97.2 F (36.2 C)   Wt 185 lb 6.4 oz (84.1 kg)   SpO2 98%   BMI 26.23 kg/m  Wt Readings from Last 3 Encounters:  06/27/20 185 lb 6.4 oz (84.1 kg)  04/25/20 196 lb 8 oz (89.1 kg)  02/28/20 205 lb (93 kg)     Health Maintenance Due  Topic Date Due  . Hepatitis C Screening  Never done  . HIV Screening  Never done  . TETANUS/TDAP  Never done    There are no preventive care reminders to display for this patient.  Lab Results  Component Value Date   TSH  1.440 12/01/2019   Lab Results  Component Value Date   WBC 6.4 12/01/2019   HGB 15.1 12/01/2019   HCT 44.3 12/01/2019   MCV 89 12/01/2019   PLT 273 12/01/2019   Lab Results  Component Value Date   NA 140 12/01/2019   K 4.1 12/01/2019   CO2 21 12/01/2019   GLUCOSE 92 12/01/2019   BUN 8 12/01/2019   CREATININE 0.82 12/01/2019   BILITOT 0.3 12/01/2019   ALKPHOS 51  12/01/2019   AST 21 12/01/2019   ALT 15 12/01/2019   PROT 5.3 (L) 12/01/2019   ALBUMIN 3.7 (L) 12/01/2019   CALCIUM 8.5 (L) 12/01/2019   ANIONGAP 6 11/22/2015   Lab Results  Component Value Date   CHOL 148 12/01/2019   Lab Results  Component Value Date   HDL 45 12/01/2019   Lab Results  Component Value Date   LDLCALC 85 12/01/2019   Lab Results  Component Value Date   TRIG 97 12/01/2019   Lab Results  Component Value Date   CHOLHDL 3.3 12/01/2019   No results found for: HGBA1C    Assessment & Plan:   Problem List Items Addressed This Visit      Other   Constipation - Primary   Relevant Medications   polyethylene glycol powder (GLYCOLAX/MIRALAX) 17 GM/SCOOP powder   Other Relevant Orders   VITAMIN D 25 Hydroxy (Vit-D Deficiency, Fractures)    Other Visit Diagnoses    History of dizziness       Relevant Orders   TSH   CBC with Differential/Platelet   Amylase   Lipase   Magnesium   VITAMIN D 25 Hydroxy (Vit-D Deficiency, Fractures)   Comprehensive metabolic panel   Low blood glucose measurement       Relevant Medications   blood glucose meter kit and supplies   Other Relevant Orders   HgB A1c   Chronic constipation       Relevant Medications   polyethylene glycol powder (GLYCOLAX/MIRALAX) 17 GM/SCOOP powder   Vitamin D deficiency       Relevant Orders   VITAMIN D 25 Hydroxy (Vit-D Deficiency, Fractures)     1. History of dizziness Labs today.  - TSH - CBC with Differential/Platelet - Amylase - Lipase - Magnesium - VITAMIN D 25 Hydroxy (Vit-D Deficiency, Fractures) - Comprehensive metabolic panel  2. Low blood glucose measurement Monitor glucose keep log.  - HgB A1c - blood glucose meter kit and supplies; Dispense based on patient and insurance preference. Use up to four times daily as directed. (FOR ICD-10 E10.9, E11.9).check fasting blood sugar.  Dispense: 1 each; Refill: 0  3. Chronic constipation - polyethylene glycol powder  (GLYCOLAX/MIRALAX) 17 GM/SCOOP powder; Take 17 g by mouth 2 (two) times daily.  Dispense: 255 g; Refill: 2  4. Constipation, unspecified constipation type Recommend Metamucil per package instructions.  - polyethylene glycol powder (GLYCOLAX/MIRALAX) 17 GM/SCOOP powder; Take 17 g by mouth 2 (two) times daily.  Dispense: 255 g; Refill: 2 - VITAMIN D 25 Hydroxy (Vit-D Deficiency, Fractures)  5. Vitamin D deficiency  - VITAMIN D 25 Hydroxy (Vit-D Deficiency, Fractures) Meds ordered this encounter  Medications  . blood glucose meter kit and supplies    Sig: Dispense based on patient and insurance preference. Use up to four times daily as directed. (FOR ICD-10 E10.9, E11.9).check fasting blood sugar.    Dispense:  1 each    Refill:  0    Order Specific Question:   Number of strips  Answer:   180    Order Specific Question:   Number of lancets    Answer:   180  . polyethylene glycol powder (GLYCOLAX/MIRALAX) 17 GM/SCOOP powder    Sig: Take 17 g by mouth 2 (two) times daily.    Dispense:  255 g    Refill:  2   Red Flags discussed. The patient was given clear instructions to go to ER or return to medical center if any red flags develop, symptoms do not improve, worsen or new problems develop. They verbalized understanding.  Follow-up: Return in about 3 weeks (around 07/18/2020), or if symptoms worsen or fail to improve, for Go to Emergency room/ urgent care if worse, at any time for any worsening symptoms.    Marcille Buffy, FNP

## 2020-06-28 ENCOUNTER — Other Ambulatory Visit: Payer: Self-pay | Admitting: Adult Health

## 2020-06-28 DIAGNOSIS — D709 Neutropenia, unspecified: Secondary | ICD-10-CM

## 2020-06-28 NOTE — Progress Notes (Signed)
  CBC shows low white blood cell and some other abnormalities. The cbc at [facility] was normal yesterday. This could be from a viral illness, reports any new symptoms to me.Need to recheck CBC in one week. Vitamin d is still low, will start prescription once we know this cbc has returned to normal. Recommend hydration with Gatorade, vitamin water or liquid iv. Would like for you to do a Covid test you can schedule at [facility] in [place]. If you develop any new symptoms or signs of acute infection such as fever, chills or pain please inform me. Seek care immediately for any emergent symptoms if you need a note to be out of work please message me back on [facility] Other labs within normal limits.

## 2020-06-29 ENCOUNTER — Encounter: Payer: Self-pay | Admitting: Adult Health

## 2020-06-30 ENCOUNTER — Telehealth: Payer: Self-pay | Admitting: Adult Health

## 2020-06-30 NOTE — Telephone Encounter (Signed)
Patient called in for refill for ALPRAZolam (XANAX) 0.25 MG tablet

## 2020-06-30 NOTE — Telephone Encounter (Signed)
RX Refill:xanax Last Seen:06-27-20 Last ordered:02-16-20

## 2020-07-03 ENCOUNTER — Other Ambulatory Visit: Payer: Self-pay | Admitting: Adult Health

## 2020-07-03 DIAGNOSIS — F419 Anxiety disorder, unspecified: Secondary | ICD-10-CM

## 2020-07-03 MED ORDER — ALPRAZOLAM 0.25 MG PO TABS: 0.2500 mg | ORAL_TABLET | Freq: Every day | ORAL | 0 refills | Status: DC

## 2020-07-03 NOTE — Progress Notes (Signed)
Refill. 07/03/2020 Anxiety - Plan: ALPRAZolam (XANAX) 0.25 MG tablet

## 2020-07-03 NOTE — Telephone Encounter (Signed)
Provider sent refill. 07/03/20

## 2020-07-04 ENCOUNTER — Other Ambulatory Visit (INDEPENDENT_AMBULATORY_CARE_PROVIDER_SITE_OTHER): Payer: BC Managed Care – PPO

## 2020-07-04 ENCOUNTER — Telehealth: Payer: Self-pay

## 2020-07-04 ENCOUNTER — Other Ambulatory Visit: Payer: Self-pay

## 2020-07-04 DIAGNOSIS — Z87898 Personal history of other specified conditions: Secondary | ICD-10-CM

## 2020-07-04 DIAGNOSIS — D709 Neutropenia, unspecified: Secondary | ICD-10-CM | POA: Diagnosis not present

## 2020-07-04 LAB — CBC WITH DIFFERENTIAL/PLATELET
Basophils Absolute: 0.1 10*3/uL (ref 0.0–0.1)
Basophils Relative: 1.4 % (ref 0.0–3.0)
Eosinophils Absolute: 0.3 10*3/uL (ref 0.0–0.7)
Eosinophils Relative: 7.4 % — ABNORMAL HIGH (ref 0.0–5.0)
HCT: 41.6 % (ref 39.0–52.0)
Hemoglobin: 14.3 g/dL (ref 13.0–17.0)
Lymphocytes Relative: 35.1 % (ref 12.0–46.0)
Lymphs Abs: 1.5 10*3/uL (ref 0.7–4.0)
MCHC: 34.3 g/dL (ref 30.0–36.0)
MCV: 87.1 fl (ref 78.0–100.0)
Monocytes Absolute: 0.3 10*3/uL (ref 0.1–1.0)
Monocytes Relative: 7.8 % (ref 3.0–12.0)
Neutro Abs: 2.1 10*3/uL (ref 1.4–7.7)
Neutrophils Relative %: 48.3 % (ref 43.0–77.0)
Platelets: 254 10*3/uL (ref 150.0–400.0)
RBC: 4.77 Mil/uL (ref 4.22–5.81)
RDW: 13.6 % (ref 11.5–15.5)
WBC: 4.2 10*3/uL (ref 4.0–10.5)

## 2020-07-04 NOTE — Telephone Encounter (Signed)
CBC labs have been ordered for repeat labs in one month.

## 2020-07-18 ENCOUNTER — Ambulatory Visit (INDEPENDENT_AMBULATORY_CARE_PROVIDER_SITE_OTHER): Payer: BC Managed Care – PPO | Admitting: Adult Health

## 2020-07-18 ENCOUNTER — Other Ambulatory Visit: Payer: Self-pay

## 2020-07-18 ENCOUNTER — Encounter: Payer: Self-pay | Admitting: Adult Health

## 2020-07-18 VITALS — BP 106/70 | HR 58 | Temp 97.7°F | Ht 70.51 in | Wt 188.6 lb

## 2020-07-18 DIAGNOSIS — D709 Neutropenia, unspecified: Secondary | ICD-10-CM | POA: Diagnosis not present

## 2020-07-18 LAB — CBC WITH DIFFERENTIAL/PLATELET
Basophils Absolute: 0.1 10*3/uL (ref 0.0–0.1)
Basophils Relative: 0.7 % (ref 0.0–3.0)
Eosinophils Absolute: 0.4 10*3/uL (ref 0.0–0.7)
Eosinophils Relative: 5 % (ref 0.0–5.0)
HCT: 45.1 % (ref 39.0–52.0)
Hemoglobin: 15.3 g/dL (ref 13.0–17.0)
Lymphocytes Relative: 18.6 % (ref 12.0–46.0)
Lymphs Abs: 1.5 10*3/uL (ref 0.7–4.0)
MCHC: 33.8 g/dL (ref 30.0–36.0)
MCV: 88.2 fl (ref 78.0–100.0)
Monocytes Absolute: 0.5 10*3/uL (ref 0.1–1.0)
Monocytes Relative: 6 % (ref 3.0–12.0)
Neutro Abs: 5.8 10*3/uL (ref 1.4–7.7)
Neutrophils Relative %: 69.7 % (ref 43.0–77.0)
Platelets: 284 10*3/uL (ref 150.0–400.0)
RBC: 5.11 Mil/uL (ref 4.22–5.81)
RDW: 13.8 % (ref 11.5–15.5)
WBC: 8.2 10*3/uL (ref 4.0–10.5)

## 2020-07-18 LAB — COMPREHENSIVE METABOLIC PANEL
ALT: 11 U/L (ref 0–53)
AST: 15 U/L (ref 0–37)
Albumin: 3.8 g/dL (ref 3.5–5.2)
Alkaline Phosphatase: 57 U/L (ref 39–117)
BUN: 6 mg/dL (ref 6–23)
CO2: 30 mEq/L (ref 19–32)
Calcium: 8.8 mg/dL (ref 8.4–10.5)
Chloride: 107 mEq/L (ref 96–112)
Creatinine, Ser: 1.01 mg/dL (ref 0.40–1.50)
GFR: 87.34 mL/min (ref >60.00)
Glucose, Bld: 83 mg/dL (ref 70–99)
Potassium: 4.4 mEq/L (ref 3.5–5.1)
Sodium: 141 mEq/L (ref 135–145)
Total Bilirubin: 0.5 mg/dL (ref 0.2–1.2)
Total Protein: 5.6 g/dL — ABNORMAL LOW (ref 6.0–8.3)

## 2020-07-18 LAB — TSH: TSH: 0.81 u[IU]/mL (ref 0.35–4.50)

## 2020-07-18 NOTE — Progress Notes (Signed)
CBC is within normal limits.  CMP is within normal limits.  TSH is normal but would like to recheck in 3 months as has been trending down.

## 2020-07-18 NOTE — Progress Notes (Signed)
Established Patient Office Visit  Subjective:  Patient ID: Nathaniel Hess, male    DOB: [birth year]  Age: 50 y.o. MRN: [id number]  CC:  Chief Complaint  Patient presents with  . Follow-up    Follow up on blood work    HPI Nathaniel Hess presents for follow up was seen in the emergency room on 06/27/20 for dizziness and fatigue/ weakness, had follow up with me on 06/27/20 he has been seen on 06/27/2020 by me and was found to have neutropenia. He had a recheck one week later and it was within normal WBC.  He reports he felt bad for about three days post last visit.   He was also found to have constipation.   Today he feels well.  Denies any symptoms.   Patient  denies any fever, body aches,chills, rash, chest pain, shortness of breath, nausea, vomiting, or diarrhea.  Denies dizziness, lightheadedness, pre syncopal or syncopal episodes.    Past Medical History:  Diagnosis Date  . Acute otitis externa of left ear 01/04/2020  . Anxiety   . Barrett esophagus   . Barrett's esophagus   . Bowel obstruction ([place])   . Depression   . GERD (gastroesophageal reflux disease)   . Left otitis media 01/04/2020    Past Surgical History:  Procedure Laterality Date  . BOWEL RESECTION N/A 11/08/2015   Procedure: SMALL BOWEL RESECTION Possible;  Surgeon: Florene Glen, MD;  Location: [facility];  Service: General;  Laterality: N/A;  . COLONOSCOPY WITH PROPOFOL N/A 02/28/2020   Procedure: COLONOSCOPY WITH PROPOFOL;  Surgeon: Lin Landsman, MD;  Location: [facility];  Service: Gastroenterology;  Laterality: N/A;  NO C-19 TEST AS OF 1/230/2021 AT 1:45 PM  . ESOPHAGOGASTRODUODENOSCOPY (EGD) WITH PROPOFOL N/A 02/28/2020   Procedure: ESOPHAGOGASTRODUODENOSCOPY (EGD) WITH PROPOFOL;  Surgeon: Lin Landsman, MD;  Location: [place];  Service: Gastroenterology;  Laterality: N/A;  . ESOPHAGUS SURGERY    . LAPAROTOMY N/A 11/08/2015   Procedure: EXPLORATORY LAPAROTOMY;  Surgeon: Florene Glen, MD;  Location: [facility];  Service: General;  Laterality: N/A;  . NISSEN FUNDOPLICATION      Family History  Problem Relation Age of Onset  . Depression Mother   . Multiple sclerosis Mother   . COPD Father   . Drug abuse Father   . Heart disease Father   . Colon polyps Father   . Diabetes Sister   . Diabetes Maternal Grandmother   . Stroke Maternal Grandmother   . Stroke Maternal Grandfather   . Heart attack Paternal Grandfather   . Prostate cancer Neg Hx   . Colon cancer Neg Hx     Social History   Socioeconomic History  . Marital status: Married    Spouse name: Not on file  . Number of children: Not on file  . Years of education: Not on file  . Highest education level: Not on file  Occupational History  . Not on file  Tobacco Use  . Smoking status: Never Smoker  . Smokeless tobacco: Never Used  Vaping Use  . Vaping Use: Never used  Substance and Sexual Activity  . Alcohol use: Yes    Alcohol/week: 2.0 standard drinks    Types: 2 Cans of beer per week    Comment: occasional  . Drug use: Yes    Frequency: 3.0 times per week    Types: Marijuana  . Sexual activity: Yes  Other Topics Concern  . Not on file  Social History  Narrative  . Not on file   Social Determinants of Health   Financial Resource Strain: Not on file  Food Insecurity: Not on file  Transportation Needs: Not on file  Physical Activity: Not on file  Stress: Not on file  Social Connections: Not on file  Intimate Partner Violence: Not on file    Outpatient Medications Prior to Visit  Medication Sig Dispense Refill  . ALPRAZolam (XANAX) 0.25 MG tablet Take 1 tablet (0.25 mg total) by mouth at bedtime. 90 tablet 0  . Blood Glucose Monitoring Suppl (ACCU-CHEK GUIDE) w/Device KIT Check blood sugar in the am fasting and before each meal. 1 kit 0  . buPROPion (WELLBUTRIN XL) 300 MG 24 hr tablet TAKE 1 TABLET BY MOUTH DAILY. TAKE HALF TABLET FOR FIRST 7 DAYS THEN INCREASE TO 1 TABLET DAILY.  90 tablet 1  . cyclobenzaprine (FLEXERIL) 10 MG tablet Take 1 tablet (10 mg total) by mouth 3 (three) times daily as needed for muscle spasms (will make you drowsy, recomend at bedtime only  if sufficent relief.). 30 tablet 0  . polyethylene glycol powder (GLYCOLAX/MIRALAX) 17 GM/SCOOP powder Take 17 g by mouth 2 (two) times daily. 255 g 2  . Vitamin D, Ergocalciferol, (DRISDOL) 1.25 MG (50000 UNIT) CAPS capsule Take 1 capsule (50,000 Units total) by mouth every 7 (seven) days. 12 capsule 0  . omeprazole (PRILOSEC) 20 MG capsule Take 1 capsule (20 mg total) by mouth daily. 90 capsule 3   No facility-administered medications prior to visit.    No Known Allergies  ROS Review of Systems  Constitutional: Negative.   HENT: Negative.   Respiratory: Negative.   Cardiovascular: Negative.   Gastrointestinal: Negative.   Genitourinary: Negative.   Musculoskeletal: Negative.   Neurological: Negative.   Psychiatric/Behavioral: Negative.       Objective:    Physical Exam Vitals reviewed.  Constitutional:      General: He is not in acute distress.    Appearance: Normal appearance. He is not ill-appearing, toxic-appearing or diaphoretic.     Comments: Patient is alert and oriented and responsive to questions Engages in eye contact with provider. Speaks in full sentences without any pauses without any shortness of breath or distress.   HENT:     Head: Normocephalic and atraumatic.     Right Ear: External ear normal.     Left Ear: External ear normal.     Nose: Nose normal.     Mouth/Throat:     Mouth: Mucous membranes are moist.  Eyes:     General: No scleral icterus.       Right eye: No discharge.        Left eye: No discharge.     Pupils: Pupils are equal, round, and reactive to light.  Cardiovascular:     Rate and Rhythm: Normal rate and regular rhythm.     Pulses: Normal pulses.     Heart sounds: Normal heart sounds. No murmur heard. No friction rub. No gallop.   Pulmonary:      Effort: Pulmonary effort is normal. No respiratory distress.     Breath sounds: Normal breath sounds. No stridor. No wheezing, rhonchi or rales.  Chest:     Chest wall: No tenderness.  Abdominal:     General: There is no distension.     Palpations: Abdomen is soft.     Tenderness: There is no abdominal tenderness.  Musculoskeletal:        General: Normal range of motion.  Cervical back: Normal range of motion and neck supple. No tenderness.  Skin:    General: Skin is warm.     Findings: No erythema or rash.  Neurological:     Mental Status: He is alert and oriented to person, place, and time.     Motor: No weakness.     Gait: Gait normal.  Psychiatric:        Mood and Affect: Mood normal.        Behavior: Behavior normal.        Thought Content: Thought content normal.        Judgment: Judgment normal.     BP 106/70 (BP Location: Left Arm, Patient Position: Sitting, Cuff Size: Normal)   Pulse (!) 58   Temp 97.7 F (36.5 C)   Ht 5' 10.51" (1.791 m)   Wt 188 lb 9.6 oz (85.5 kg)   SpO2 96%   BMI 26.67 kg/m  Wt Readings from Last 3 Encounters:  07/18/20 188 lb 9.6 oz (85.5 kg)  06/27/20 185 lb 6.4 oz (84.1 kg)  04/25/20 196 lb 8 oz (89.1 kg)     Health Maintenance Due  Topic Date Due  . HIV Screening  Never done  . Hepatitis C Screening  Never done  . TETANUS/TDAP  Never done    There are no preventive care reminders to display for this patient.  Lab Results  Component Value Date   TSH 1.24 06/27/2020   Lab Results  Component Value Date   WBC 4.2 07/04/2020   HGB 14.3 07/04/2020   HCT 41.6 07/04/2020   MCV 87.1 07/04/2020   PLT 254.0 07/04/2020   Lab Results  Component Value Date   NA 142 06/27/2020   K 4.3 06/27/2020   CO2 28 06/27/2020   GLUCOSE 90 06/27/2020   BUN 6 06/27/2020   CREATININE 1.12 06/27/2020   BILITOT 0.5 06/27/2020   ALKPHOS 60 06/27/2020   AST 20 06/27/2020   ALT 15 06/27/2020   PROT 6.1 06/27/2020   ALBUMIN 4.1 06/27/2020    CALCIUM 9.2 06/27/2020   ANIONGAP 6 11/22/2015   GFR 77.19 06/27/2020   Lab Results  Component Value Date   CHOL 148 12/01/2019   Lab Results  Component Value Date   HDL 45 12/01/2019   Lab Results  Component Value Date   LDLCALC 85 12/01/2019   Lab Results  Component Value Date   TRIG 97 12/01/2019   Lab Results  Component Value Date   CHOLHDL 3.3 12/01/2019   Lab Results  Component Value Date   HGBA1C 5.4 06/27/2020      Assessment & Plan:   Problem List Items Addressed This Visit   None   Visit Diagnoses    Neutropenia, unspecified type ([place])    -  Primary   Relevant Orders   CBC with Differential/Platelet   Comprehensive metabolic panel   TSH    Suspect low WBC was viral illness given patient's last CBC was normal on recheck, he denies any ongoing symptoms. All symptoms have resolved. Also labs at [facility] the day before last check were within normal limits as well;. Could be lab result error as well.   Orders Placed This Encounter  Procedures  . CBC with Differential/Platelet  . Comprehensive metabolic panel  . TSH   Return in about 3 months (around 10/18/2020), or if symptoms worsen or fail to improve, for at any time for any worsening symptoms, Go to Emergency room/ urgent care if worse. Return  precautions given. Red Flags discussed. The patient was given clear instructions to go to ER or return to medical center if any red flags develop, symptoms do not improve, worsen or new problems develop. They verbalized understanding.    Risks, benefits, and alternatives of the medications and treatment plan prescribed today were discussed, and patient expressed understanding.    Education regarding symptom management and diagnosis given to patient on AVS.  Patient was in agreement with treatment plan.   Continue to follow with  Kelby Aline. Israella Hubert AGNP-C, FNP-C for routine health maintenance.   Kelby Aline. Linzee Depaul AGNP-C, FNP-C  No orders of the defined  types were placed in this encounter.   Follow-up: Return in about 3 months (around 10/18/2020), or if symptoms worsen or fail to improve, for at any time for any worsening symptoms, Go to Emergency room/ urgent care if worse.    Marcille Buffy, FNP

## 2020-07-18 NOTE — Patient Instructions (Addendum)
Health Maintenance, Male Adopting a healthy lifestyle and getting preventive care are important in promoting health and wellness. Ask your health care provider about:  The right schedule for you to have regular tests and exams.  Things you can do on your own to prevent diseases and keep yourself healthy. What should I know about diet, weight, and exercise? Eat a healthy diet  Eat a diet that includes plenty of vegetables, fruits, low-fat dairy products, and lean protein.  Do not eat a lot of foods that are high in solid fats, added sugars, or sodium.   Maintain a healthy weight Body mass index (BMI) is a measurement that can be used to identify possible weight problems. It estimates body fat based on height and weight. Your health care provider can help determine your BMI and help you achieve or maintain a healthy weight. Get regular exercise Get regular exercise. This is one of the most important things you can do for your health. Most adults should:  Exercise for at least 150 minutes each week. The exercise should increase your heart rate and make you sweat (moderate-intensity exercise).  Do strengthening exercises at least twice a week. This is in addition to the moderate-intensity exercise.  Spend less time sitting. Even light physical activity can be beneficial. Watch cholesterol and blood lipids Have your blood tested for lipids and cholesterol at 50 years of age, then have this test every 5 years. You may need to have your cholesterol levels checked more often if:  Your lipid or cholesterol levels are high.  You are older than 50 years of age.  You are at high risk for heart disease. What should I know about cancer screening? Many types of cancers can be detected early and may often be prevented. Depending on your health history and family history, you may need to have cancer screening at various ages. This may include screening for:  Colorectal cancer.  Prostate  cancer.  Skin cancer.  Lung cancer. What should I know about heart disease, diabetes, and high blood pressure? Blood pressure and heart disease  High blood pressure causes heart disease and increases the risk of stroke. This is more likely to develop in people who have high blood pressure readings, are of African descent, or are overweight.  Talk with your health care provider about your target blood pressure readings.  Have your blood pressure checked: ? Every 3-5 years if you are 18-39 years of age. ? Every year if you are 40 years old or older.  If you are between the ages of 65 and 75 and are a current or former smoker, ask your health care provider if you should have a one-time screening for abdominal aortic aneurysm (AAA). Diabetes Have regular diabetes screenings. This checks your fasting blood sugar level. Have the screening done:  Once every three years after age 45 if you are at a normal weight and have a low risk for diabetes.  More often and at a younger age if you are overweight or have a high risk for diabetes. What should I know about preventing infection? Hepatitis B If you have a higher risk for hepatitis B, you should be screened for this virus. Talk with your health care provider to find out if you are at risk for hepatitis B infection. Hepatitis C Blood testing is recommended for:  Everyone born from 1945 through 1965.  Anyone with known risk factors for hepatitis C. Sexually transmitted infections (STIs)  You should be screened each   year for STIs, including gonorrhea and chlamydia, if: ? You are sexually active and are younger than 50 years of age. ? You are older than 50 years of age and your health care provider tells you that you are at risk for this type of infection. ? Your sexual activity has changed since you were last screened, and you are at increased risk for chlamydia or gonorrhea. Ask your health care provider if you are at risk.  Ask your  health care provider about whether you are at high risk for HIV. Your health care provider may recommend a prescription medicine to help prevent HIV infection. If you choose to take medicine to prevent HIV, you should first get tested for HIV. You should then be tested every 3 months for as long as you are taking the medicine. Follow these instructions at home: Lifestyle  Do not use any products that contain nicotine or tobacco, such as cigarettes, e-cigarettes, and chewing tobacco. If you need help quitting, ask your health care provider.  Do not use street drugs.  Do not share needles.  Ask your health care provider for help if you need support or information about quitting drugs. Alcohol use  Do not drink alcohol if your health care provider tells you not to drink.  If you drink alcohol: ? Limit how much you have to 0-2 drinks a day. ? Be aware of how much alcohol is in your drink. In the [place] one drink equals one 12 oz bottle of beer (355 mL), one 5 oz glass of wine (148 mL), or one 1 oz glass of hard liquor (44 mL). General instructions  Schedule regular health, dental, and eye exams.  Stay current with your vaccines.  Tell your health care provider if: ? You often feel depressed. ? You have ever been abused or do not feel safe at home. Summary  Adopting a healthy lifestyle and getting preventive care are important in promoting health and wellness.  Follow your health care provider's instructions about healthy diet, exercising, and getting tested or screened for diseases.  Follow your health care provider's instructions on monitoring your cholesterol and blood pressure. This information is not intended to replace advice given to you by your health care provider. Make sure you discuss any questions you have with your health care provider. Document Revised: 02/04/2018 Document Reviewed: 02/04/2018 Elsevier Patient Education  2021 [place].  Neutropenia Neutropenia is  a condition that occurs when you have a lower-than-normal level of a type of white blood cell (neutrophil) in your body. Neutrophils are made in the spongy center of large bones (bone marrow), and they fight infections. Neutrophils are your body's main defense against bacterial and fungal infections. The fewer neutrophils you have and the longer your body remains without them, the greater your risk of getting a severe infection. What are the causes? This condition can occur if your body uses up or destroys neutrophils faster than your bone marrow can make them. Neutropenia may be caused by:  A bacterial or fungal infection.  Allergic disorders.  Reactions to some medicines.  An autoimmune disease.  An enlarged spleen. This condition can also occur if your bone marrow does not produce enough neutrophils. This problem may be caused by:  Cancer.  Cancer treatments, such as radiation or chemotherapy.  Viral infections.  Medicines, such as phenytoin.  Vitamin B12 deficiency.  Diseases of the bone marrow.  Environmental toxins, such as insecticides. What are the signs or symptoms? This condition does  not usually cause symptoms. If symptoms are present, they are usually caused by an underlying infection. Symptoms of an infection may include:  Fever.  Chills.  Swollen glands.  Oral or anal ulcers.  Cough and shortness of breath.  Rash.  Skin infection.  Fatigue. How is this diagnosed? Your health care provider may suspect neutropenia if you have:  A condition that may cause neutropenia.  Symptoms during or after treatment for cancer.  Symptoms of infection, especially fever.  Frequent and unusual infections. This condition is diagnosed based on your medical history and a physical exam. Tests will also be done, such as:  A complete blood count (CBC).  A procedure to collect a sample of bone marrow for examination (bone marrow biopsy).  A chest X-ray.  A urine  culture.  A blood culture. How is this treated? Treatment depends on the underlying cause and severity of your condition. Mild neutropenia may not require treatment. Treatment may include medicines, such as:  Antibiotic medicine given through an IV.  Antiviral medicines.  Antifungal medicines.  A medicine to increase neutrophil production (colony-stimulating factor). You may get this drug through an IV or by injection.  Steroids given through an IV. If an underlying condition is causing neutropenia, you may need treatment for that condition. If medicines or cancer treatments are causing neutropenia, your health care provider may have you stop the medicines or treatment. Follow these instructions at home: Medicines  Take over-the-counter and prescription medicines only as told by your health care provider.  Get a seasonal flu shot (influenza vaccine).  Avoid people who received a vaccine in the past 30 days if that vaccine contained a live version of the germ (live vaccine). You should not get a live vaccine. Common live vaccines are polio, MMR, chicken pox, and shingles vaccines.   Eating and drinking  Do not share food utensils.  Do not eat unpasteurized foods.  Do not eat raw or undercooked meat, eggs, or seafood.  Do not eat unwashed, raw fruits or vegetables. Lifestyle  Avoid exposure to groups of people or children.  Avoid being around people who are sick.  Avoid being around dirt or dust, such as in construction areas or gardens.  Do not provide direct care for pets. Avoid animal droppings. Do not clean litter boxes and bird cages.  Do not have sex unless your health care provider has approved. Hygiene  Bathe daily.  Clean the area between the genitals and the anus (perineal area) after you urinate or have a bowel movement. If you are male, wipe from front to back.  Brush your teeth with a soft toothbrush before and after meals.  Do not use a regular razor.  Use an electric razor to remove hair.  Wash your hands often. Make sure others who come in contact with you also wash their hands. If soap and water are not available, use hand sanitizer.   General instructions  Follow any precautions as told by your health care provider to reduce your risk for injury or infection.  Take actions to avoid cuts and burns. For example: ? Be cautious when you use knives. Always cut away from yourself. ? Keep knives in protective sheaths or guards when not in use. ? Use oven mitts when you cook with a hot stove, oven, or grill. ? Stand a safe distance away from open fires.  Do not use tampons, enemas, or rectal suppositories unless your health care provider has approved.  Keep all follow-up visits  as told by your health care provider. This is important. Contact a health care provider if:  You have: ? A sore throat. ? A warm, red, or tender area on your skin. ? A cough. ? Frequent or painful urination. ? Vaginal discharge or itching.  You develop: ? Sores in your mouth or anus. ? Swollen lymph nodes. ? Red streaks on the skin. ? A rash. Get help right away if:  You have: ? A fever. ? Chills, or you start to shake.  You feel: ? Nauseous, or you vomit. ? Very fatigued. ? Short of breath. Summary  Neutropenia is a condition that occurs when you have a lower-than-normal level of a type of white blood cell (neutrophil) in your body.  This condition can occur if your body uses up or destroys neutrophils faster than your bone marrow can make them.  Treatment depends on the underlying cause and severity of your condition. Mild neutropenia may not require treatment.  Follow any precautions as told by your health care provider to reduce your risk for injury or infection. This information is not intended to replace advice given to you by your health care provider. Make sure you discuss any questions you have with your health care provider. Document  Revised: 11/27/2017 Document Reviewed: 11/27/2017 Elsevier Patient Education  2021 [employer].

## 2020-08-04 ENCOUNTER — Other Ambulatory Visit: Payer: Self-pay | Admitting: Adult Health

## 2020-08-04 ENCOUNTER — Other Ambulatory Visit: Payer: BC Managed Care – PPO

## 2020-08-04 ENCOUNTER — Other Ambulatory Visit: Payer: Self-pay | Admitting: Gastroenterology

## 2020-08-04 DIAGNOSIS — K5909 Other constipation: Secondary | ICD-10-CM

## 2020-08-04 DIAGNOSIS — E559 Vitamin D deficiency, unspecified: Secondary | ICD-10-CM

## 2020-08-31 ENCOUNTER — Other Ambulatory Visit: Payer: Self-pay | Admitting: Adult Health

## 2020-08-31 DIAGNOSIS — E162 Hypoglycemia, unspecified: Secondary | ICD-10-CM

## 2020-10-20 ENCOUNTER — Other Ambulatory Visit: Payer: Self-pay

## 2020-10-20 DIAGNOSIS — F419 Anxiety disorder, unspecified: Secondary | ICD-10-CM

## 2020-10-20 MED ORDER — ALPRAZOLAM 0.25 MG PO TABS: 0.2500 mg | ORAL_TABLET | Freq: Every day | ORAL | 0 refills | Status: DC

## 2020-10-20 NOTE — Telephone Encounter (Signed)
Last OV-5/24 Last filled-5/9 Next OV- pending provider schedule

## 2020-10-23 DIAGNOSIS — M25561 Pain in right knee: Secondary | ICD-10-CM | POA: Insufficient documentation

## 2020-10-23 DIAGNOSIS — M25469 Effusion, unspecified knee: Secondary | ICD-10-CM | POA: Insufficient documentation

## 2020-10-23 DIAGNOSIS — M25461 Effusion, right knee: Secondary | ICD-10-CM | POA: Diagnosis not present

## 2020-10-23 DIAGNOSIS — M7041 Prepatellar bursitis, right knee: Secondary | ICD-10-CM | POA: Diagnosis not present

## 2020-11-06 DIAGNOSIS — M1711 Unilateral primary osteoarthritis, right knee: Secondary | ICD-10-CM | POA: Diagnosis not present

## 2020-11-24 ENCOUNTER — Other Ambulatory Visit: Payer: Self-pay | Admitting: Adult Health

## 2020-11-24 DIAGNOSIS — F419 Anxiety disorder, unspecified: Secondary | ICD-10-CM

## 2020-11-24 NOTE — Telephone Encounter (Signed)
[facility] faxed refill request for the following medications:  buPROPion (WELLBUTRIN XL) 300 MG 24 hr tablet    Please advise.  

## 2020-11-24 NOTE — Telephone Encounter (Signed)
LOV: 07/18/2020 Marcelino Duster Flinchum, NP)  Next OV: not scheduled  Last refill: 03/23/2020 Marcelino Duster Flinchum)  Please review. Thanks!

## 2020-11-27 MED ORDER — BUPROPION HCL ER (XL) 300 MG PO TB24: 300.0000 mg | ORAL_TABLET | Freq: Every day | ORAL | 0 refills | Status: DC

## 2020-12-04 DIAGNOSIS — M25461 Effusion, right knee: Secondary | ICD-10-CM | POA: Diagnosis not present

## 2020-12-04 DIAGNOSIS — M25561 Pain in right knee: Secondary | ICD-10-CM | POA: Diagnosis not present

## 2020-12-04 DIAGNOSIS — M7041 Prepatellar bursitis, right knee: Secondary | ICD-10-CM | POA: Diagnosis not present

## 2020-12-06 DIAGNOSIS — M25561 Pain in right knee: Secondary | ICD-10-CM | POA: Diagnosis not present

## 2020-12-14 DIAGNOSIS — M65861 Other synovitis and tenosynovitis, right lower leg: Secondary | ICD-10-CM | POA: Diagnosis not present

## 2021-01-12 ENCOUNTER — Telehealth: Payer: Self-pay | Admitting: Adult Health

## 2021-01-12 NOTE — Telephone Encounter (Signed)
Patient's wife called , her husband has the same thing she has. She states she knows she doesn't  have COVID, flu, and RSV, because our office tested her yesterday. Husband is at work, but he has throat sore, coughing, fever, just like wife. Could not triage because patient is at work until [employer]. Wife would like to know what he should take?

## 2021-01-15 NOTE — Telephone Encounter (Signed)
Patient denied appointment he stated he is feeling better now.

## 2021-02-22 ENCOUNTER — Other Ambulatory Visit: Payer: Self-pay | Admitting: Family Medicine

## 2021-02-22 DIAGNOSIS — F419 Anxiety disorder, unspecified: Secondary | ICD-10-CM

## 2021-02-22 NOTE — Telephone Encounter (Signed)
Requested medications are due for refill today.  yes  Requested medications are on the active medications list.  yes  Last refill. 11/27/2020  Future visit scheduled.   no  Notes to clinic.  Pt last seen in office on 07/18/2020. Note from that visit has pt returning for follow up in 09/2020. Pt missed an appointment and not rescheduled. Pt was last seen by Ms. Flinchum and has not established care with a new PCP.    Requested Prescriptions  Pending Prescriptions Disp Refills   buPROPion (WELLBUTRIN XL) 300 MG 24 hr tablet [Pharmacy Med Name: BUPROPION HCL XL 300 MG TABLET] 30 tablet 0    Sig: TAKE 1 TABLET BY MOUTH EVERY DAY     Psychiatry: Antidepressants - bupropion Failed - 02/22/2021  1:36 AM      Failed - Valid encounter within last 6 months    Recent Outpatient Visits           9 months ago No-show for appointment   [facility] Flinchum, Eula Fried, FNP   1 year ago Lumbosacral strain, initial encounter   [facility] Flinchum, Eula Fried, FNP   1 year ago Anxiety    [facility] Flinchum, Eula Fried, FNP   1 year ago Routine health maintenance   [facility] Flinchum, Eula Fried, FNP   4 years ago Anxiety   [facility] Kyung Rudd, Alison Stalling, NP              Passed - Completed PHQ-2 or PHQ-9 in the last 360 days      Passed - Last BP in normal range    BP Readings from Last 1 Encounters:  07/18/20 106/70

## 2021-03-08 ENCOUNTER — Telehealth: Payer: Self-pay | Admitting: Adult Health

## 2021-03-08 ENCOUNTER — Other Ambulatory Visit: Payer: Self-pay

## 2021-03-08 DIAGNOSIS — F419 Anxiety disorder, unspecified: Secondary | ICD-10-CM

## 2021-03-08 NOTE — Telephone Encounter (Signed)
Pt wife called in requesting refill on medication (ALPRAZolam (XANAX) 0.25 MG tablet). Pt requesting callback

## 2021-03-08 NOTE — Telephone Encounter (Signed)
Last OV - 07/18/2020 Next OV - N/A Last Fill - 10/20/2020

## 2021-03-08 NOTE — Telephone Encounter (Signed)
Needs follow up scheduled within 3 months.

## 2021-03-16 NOTE — Telephone Encounter (Signed)
Pt wife calling back to schedule follow up visit for pt.

## 2021-03-16 NOTE — Telephone Encounter (Signed)
Will pt need visit before you refill? Medication pended.

## 2021-03-20 NOTE — Telephone Encounter (Signed)
Yes once appointment scheduled can send 30 days just let me know.

## 2021-03-20 NOTE — Telephone Encounter (Signed)
Patient wife to cal back and schedule appt. Patient aware appointment needed.

## 2021-03-21 MED ORDER — ALPRAZOLAM 0.25 MG PO TABS: 0.2500 mg | ORAL_TABLET | Freq: Every day | ORAL | 0 refills | Status: AC

## 2021-03-26 ENCOUNTER — Other Ambulatory Visit: Payer: Self-pay | Admitting: Adult Health

## 2021-03-26 DIAGNOSIS — F419 Anxiety disorder, unspecified: Secondary | ICD-10-CM

## 2021-03-30 ENCOUNTER — Other Ambulatory Visit: Payer: Self-pay | Admitting: Adult Health

## 2021-03-30 DIAGNOSIS — F419 Anxiety disorder, unspecified: Secondary | ICD-10-CM

## 2021-04-04 ENCOUNTER — Ambulatory Visit (INDEPENDENT_AMBULATORY_CARE_PROVIDER_SITE_OTHER): Payer: BC Managed Care – PPO | Admitting: Adult Health

## 2021-04-04 ENCOUNTER — Other Ambulatory Visit: Payer: Self-pay

## 2021-04-04 ENCOUNTER — Encounter: Payer: Self-pay | Admitting: Adult Health

## 2021-04-04 VITALS — BP 116/76 | HR 63 | Temp 98.1°F | Resp 16 | Ht 70.51 in | Wt 182.4 lb

## 2021-04-04 DIAGNOSIS — Z1389 Encounter for screening for other disorder: Secondary | ICD-10-CM

## 2021-04-04 DIAGNOSIS — E559 Vitamin D deficiency, unspecified: Secondary | ICD-10-CM | POA: Diagnosis not present

## 2021-04-04 DIAGNOSIS — F419 Anxiety disorder, unspecified: Secondary | ICD-10-CM | POA: Diagnosis not present

## 2021-04-04 DIAGNOSIS — Z0000 Encounter for general adult medical examination without abnormal findings: Secondary | ICD-10-CM | POA: Diagnosis not present

## 2021-04-04 DIAGNOSIS — F411 Generalized anxiety disorder: Secondary | ICD-10-CM | POA: Diagnosis not present

## 2021-04-04 DIAGNOSIS — Z125 Encounter for screening for malignant neoplasm of prostate: Secondary | ICD-10-CM | POA: Diagnosis not present

## 2021-04-04 DIAGNOSIS — E162 Hypoglycemia, unspecified: Secondary | ICD-10-CM | POA: Insufficient documentation

## 2021-04-04 DIAGNOSIS — F332 Major depressive disorder, recurrent severe without psychotic features: Secondary | ICD-10-CM

## 2021-04-04 LAB — URINALYSIS, ROUTINE W REFLEX MICROSCOPIC
Bilirubin Urine: NEGATIVE
Hgb urine dipstick: NEGATIVE
Ketones, ur: NEGATIVE
Leukocytes,Ua: NEGATIVE
Nitrite: NEGATIVE
RBC / HPF: NONE SEEN (ref 0–?)
Specific Gravity, Urine: >=1.03 — AB (ref 1.000–1.030)
Total Protein, Urine: NEGATIVE
Urine Glucose: NEGATIVE
Urobilinogen, UA: 0.2 (ref 0.0–1.0)
pH: 5.5 (ref 5.0–8.0)

## 2021-04-04 LAB — CBC
HCT: 40.4 % (ref 39.0–52.0)
Hemoglobin: 13.5 g/dL (ref 13.0–17.0)
MCHC: 33.5 g/dL (ref 30.0–36.0)
MCV: 90.1 fl (ref 78.0–100.0)
Platelets: 234 10*3/uL (ref 150.0–400.0)
RBC: 4.48 Mil/uL (ref 4.22–5.81)
RDW: 13.6 % (ref 11.5–15.5)
WBC: 9.4 10*3/uL (ref 4.0–10.5)

## 2021-04-04 LAB — COMPREHENSIVE METABOLIC PANEL
ALT: 13 U/L (ref 0–53)
AST: 19 U/L (ref 0–37)
Albumin: 4.1 g/dL (ref 3.5–5.2)
Alkaline Phosphatase: 55 U/L (ref 39–117)
BUN: 10 mg/dL (ref 6–23)
CO2: 22 mEq/L (ref 19–32)
Calcium: 9.1 mg/dL (ref 8.4–10.5)
Chloride: 108 mEq/L (ref 96–112)
Creatinine, Ser: 0.92 mg/dL (ref 0.40–1.50)
GFR: 97.21 mL/min (ref >60.00)
Glucose, Bld: 80 mg/dL (ref 70–99)
Potassium: 4 mEq/L (ref 3.5–5.1)
Sodium: 142 mEq/L (ref 135–145)
Total Bilirubin: 0.8 mg/dL (ref 0.2–1.2)
Total Protein: 6.3 g/dL (ref 6.0–8.3)

## 2021-04-04 LAB — LIPID PANEL
Cholesterol: 192 mg/dL (ref 0–200)
HDL: 53.1 mg/dL (ref >39.00)
LDL Cholesterol: 113 mg/dL — ABNORMAL HIGH (ref 0–99)
NonHDL: 139.12
Total CHOL/HDL Ratio: 4
Triglycerides: 131 mg/dL (ref 0.0–149.0)
VLDL: 26.2 mg/dL (ref 0.0–40.0)

## 2021-04-04 LAB — TSH: TSH: 1.52 u[IU]/mL (ref 0.35–5.50)

## 2021-04-04 LAB — PSA: PSA: 1.15 ng/mL (ref 0.10–4.00)

## 2021-04-04 LAB — VITAMIN D 25 HYDROXY (VIT D DEFICIENCY, FRACTURES): VITD: 10.51 ng/mL — ABNORMAL LOW (ref 30.00–100.00)

## 2021-04-04 LAB — HEMOGLOBIN A1C: Hgb A1c MFr Bld: 5.3 % (ref 4.6–6.5)

## 2021-04-04 MED ORDER — BUPROPION HCL ER (XL) 300 MG PO TB24: 300.0000 mg | ORAL_TABLET | Freq: Every day | ORAL | 3 refills | Status: AC

## 2021-04-04 MED ORDER — VITAMIN D (ERGOCALCIFEROL) 1.25 MG (50000 UNIT) PO CAPS: 50000.0000 [IU] | ORAL_CAPSULE | ORAL | 1 refills | Status: DC

## 2021-04-04 NOTE — Progress Notes (Signed)
Established Patient Office Visit  Subjective:  Patient ID: Nathaniel Hess, male    DOB: [birth year]  Age: 51 y.o. MRN: [id number]  CC:  Chief Complaint  Patient presents with   Annual Exam    No concerns     HPI Nathaniel Hess presents for annual physical.  He feels fine today has no concerns.  Doing well on Wellbutrin has no suicidal or homicidal ideations or intent.  Denies any urinary symptoms. Denies any rectal pain or pressure.    Patient  denies any fever, body aches,chills, rash, chest pain, shortness of breath, nausea, vomiting, or diarrhea.  Denies dizziness, lightheadedness, pre syncopal or syncopal episodes.   Colorectal  Cancer Screening: UTD 03/26/20 Prostate Cancer Screening:  PSA due   Lung Cancer Screening: No 30 year pack year history and > 50 years to 80 years.      Past Medical History:  Diagnosis Date   Acute otitis externa of left ear 01/04/2020   Anxiety    Barrett esophagus    Barrett's esophagus    Bowel obstruction (HCC)    Depression    GERD (gastroesophageal reflux disease)    Left otitis media 01/04/2020    Past Surgical History:  Procedure Laterality Date   BOWEL RESECTION N/A 11/08/2015   Procedure: SMALL BOWEL RESECTION Possible;  Surgeon: Florene Glen, MD;  Location: [facility];  Service: General;  Laterality: N/A;   COLONOSCOPY WITH PROPOFOL N/A 02/28/2020   Procedure: COLONOSCOPY WITH PROPOFOL;  Surgeon: Lin Landsman, MD;  Location: [facility];  Service: Gastroenterology;  Laterality: N/A;  NO C-19 TEST AS OF 1/230/2021 AT 1:45 PM   ESOPHAGOGASTRODUODENOSCOPY (EGD) WITH PROPOFOL N/A 02/28/2020   Procedure: ESOPHAGOGASTRODUODENOSCOPY (EGD) WITH PROPOFOL;  Surgeon: Lin Landsman, MD;  Location: [facility];  Service: Gastroenterology;  Laterality: N/A;   ESOPHAGUS SURGERY     LAPAROTOMY N/A 11/08/2015   Procedure: EXPLORATORY LAPAROTOMY;  Surgeon: Florene Glen, MD;  Location: [facility];  Service: General;   Laterality: N/A;   NISSEN FUNDOPLICATION      Family History  Problem Relation Age of Onset   Depression Mother    Multiple sclerosis Mother    COPD Father    Drug abuse Father    Heart disease Father    Colon polyps Father    Diabetes Sister    Diabetes Maternal Grandmother    Stroke Maternal Grandmother    Stroke Maternal Grandfather    Heart attack Paternal Grandfather    Prostate cancer Neg Hx    Colon cancer Neg Hx     Social History   Socioeconomic History   Marital status: Married    Spouse name: Not on file   Number of children: Not on file   Years of education: Not on file   Highest education level: Not on file  Occupational History   Not on file  Tobacco Use   Smoking status: Never   Smokeless tobacco: Never  Vaping Use   Vaping Use: Never used  Substance and Sexual Activity   Alcohol use: Yes    Alcohol/week: 2.0 standard drinks    Types: 2 Cans of beer per week    Comment: occasional   Drug use: Yes    Frequency: 3.0 times per week    Types: Marijuana   Sexual activity: Yes  Other Topics Concern   Not on file  Social History Narrative   Not on file   Social Determinants of Health   Financial  Resource Strain: Not on file  Food Insecurity: Not on file  Transportation Needs: Not on file  Physical Activity: Not on file  Stress: Not on file  Social Connections: Not on file  Intimate Partner Violence: Not on file    Outpatient Medications Prior to Visit  Medication Sig Dispense Refill   diclofenac (VOLTAREN) 75 MG EC tablet diclofenac sodium 75 mg tablet,delayed release  TAKE 1 TABLET BY MOUTH TWICE A DAY WITH FOOD     ALPRAZolam (XANAX) 0.25 MG tablet Take 1 tablet (0.25 mg total) by mouth at bedtime. 90 tablet 0   Blood Glucose Monitoring Suppl (ACCU-CHEK GUIDE) w/Device KIT Check blood sugar in the am fasting and before each meal. 1 kit 0   [facility] DAILY FIBER 0.52 g capsule TAKE 1 CAPSULE BY MOUTH DAILY. 100 capsule 0   omeprazole (PRILOSEC)  20 MG capsule Take 1 capsule (20 mg total) by mouth daily. 90 capsule 3   polyethylene glycol powder (GLYCOLAX/MIRALAX) 17 GM/SCOOP powder Take 17 g by mouth 2 (two) times daily. 255 g 2   buPROPion (WELLBUTRIN XL) 300 MG 24 hr tablet TAKE 1 TABLET BY MOUTH EVERY DAY 30 tablet 0   cyclobenzaprine (FLEXERIL) 10 MG tablet Take 1 tablet (10 mg total) by mouth 3 (three) times daily as needed for muscle spasms (will make you drowsy, recomend at bedtime only  if sufficent relief.). 30 tablet 0   escitalopram (LEXAPRO) 10 MG tablet escitalopram 10 mg tablet  TAKE 1 TABLET BY MOUTH EVERY DAY     Vitamin D, Ergocalciferol, (DRISDOL) 1.25 MG (50000 UNIT) CAPS capsule Take 1 capsule (50,000 Units total) by mouth every 7 (seven) days. 12 capsule 0   No facility-administered medications prior to visit.    No Known Allergies  ROS Review of Systems  Constitutional: Negative.   HENT: Negative.    Respiratory: Negative.    Cardiovascular: Negative.   Gastrointestinal: Negative.   Genitourinary: Negative.   Musculoskeletal: Negative.   Skin: Negative.   Neurological: Negative.   Hematological: Negative.   Psychiatric/Behavioral: Negative.       Objective:    Physical Exam  BP 116/76 (BP Location: Left Arm, Patient Position: Sitting, Cuff Size: Small)    Pulse 63    Temp 98.1 F (36.7 C) (Oral)    Resp 16    Ht 5' 10.51" (1.791 m)    Wt 182 lb 6.4 oz (82.7 kg)    SpO2 97%    BMI 25.79 kg/m  Wt Readings from Last 3 Encounters:  04/04/21 182 lb 6.4 oz (82.7 kg)  07/18/20 188 lb 9.6 oz (85.5 kg)  06/27/20 185 lb 6.4 oz (84.1 kg)    General: Appearance:     Well developed, well nourished male in no acute distress  Eyes:    PERRL, conjunctiva/corneas clear, EOM's intact       Lungs:     Clear to auscultation bilaterally, respirations unlabored  Heart:    Normal heart rate. Normal rhythm. No murmurs, rubs, or gallops.    MS:   All extremities are intact.    Neurologic:   Awake, alert,  oriented x 3. No apparent focal neurological           defect.     Abdomen normal to inspection. Bowel sounds normoactive throughout all quadrants. No gross deformity. No rash. No ecchymosis. Normoactive bowel sounds active all four quadrants. No pulsatile aorta or mass visualized. No diffuse tenderness with palpation. No guarding or facial grimace.  No rigidity. No rebound tenderness. No palpable mass. No organomegaly. No CVA tenderness with percussion.   There are no preventive care reminders to display for this patient.   There are no preventive care reminders to display for this patient.  Lab Results  Component Value Date   TSH 1.52 04/04/2021   Lab Results  Component Value Date   WBC 9.4 04/04/2021   HGB 13.5 04/04/2021   HCT 40.4 04/04/2021   MCV 90.1 04/04/2021   PLT 234.0 04/04/2021   Lab Results  Component Value Date   NA 142 04/04/2021   K 4.0 04/04/2021   CO2 22 04/04/2021   GLUCOSE 80 04/04/2021   BUN 10 04/04/2021   CREATININE 0.92 04/04/2021   BILITOT 0.8 04/04/2021   ALKPHOS 55 04/04/2021   AST 19 04/04/2021   ALT 13 04/04/2021   PROT 6.3 04/04/2021   ALBUMIN 4.1 04/04/2021   CALCIUM 9.1 04/04/2021   ANIONGAP 6 11/22/2015   GFR 97.21 04/04/2021   Lab Results  Component Value Date   CHOL 192 04/04/2021   Lab Results  Component Value Date   HDL 53.10 04/04/2021   Lab Results  Component Value Date   LDLCALC 113 (H) 04/04/2021   Lab Results  Component Value Date   TRIG 131.0 04/04/2021   Lab Results  Component Value Date   CHOLHDL 4 04/04/2021   Lab Results  Component Value Date   HGBA1C 5.3 04/04/2021      Assessment & Plan:   Problem List Items Addressed This Visit       Endocrine   Low blood glucose measurement   Relevant Orders   Hemoglobin A1c (Completed)     Other   Generalized anxiety disorder   Relevant Medications   buPROPion (WELLBUTRIN XL) 300 MG 24 hr tablet   Other Relevant Orders   CBC (Completed)    Comprehensive metabolic panel (Completed)   TSH (Completed)   Hemoglobin A1c (Completed)   Depression   Relevant Medications   buPROPion (WELLBUTRIN XL) 300 MG 24 hr tablet   Other Relevant Orders   Lipid panel (Completed)   Screening for blood or protein in urine   Relevant Orders   Urinalysis, Routine w reflex microscopic (Completed)   Anxiety - Primary   Relevant Medications   buPROPion (WELLBUTRIN XL) 300 MG 24 hr tablet   Other Relevant Orders   CBC (Completed)   Comprehensive metabolic panel (Completed)   TSH (Completed)   Vitamin D insufficiency   Relevant Orders   Vitamin D 1,25 dihydroxy   Vitamin D (25 hydroxy) (Completed)   Other Visit Diagnoses     Screening for prostate cancer       Relevant Orders   PSA (Completed)       Meds ordered this encounter  Medications   DISCONTD: Vitamin D, Ergocalciferol, (DRISDOL) 1.25 MG (50000 UNIT) CAPS capsule    Sig: Take 1 capsule (50,000 Units total) by mouth every 7 (seven) days.    Dispense:  12 capsule    Refill:  1   buPROPion (WELLBUTRIN XL) 300 MG 24 hr tablet    Sig: Take 1 tablet (300 mg total) by mouth daily.    Dispense:  90 tablet    Refill:  3    Courtesy refill. Patient needs an office visit for further refills.    Red Flags discussed. The patient was given clear instructions to go to ER or return to medical center if any red flags develop, symptoms do not  improve, worsen or new problems develop. They verbalized understanding.   Follow-up: Return in about 6 months (around 10/02/2021), or if symptoms worsen or fail to improve, for at any time for any worsening symptoms, Go to Emergency room/ urgent care if worse.    Marcille Buffy, FNP

## 2021-04-04 NOTE — Patient Instructions (Addendum)
Health Maintenance, Male °Adopting a healthy lifestyle and getting preventive care are important in promoting health and wellness. Ask your health care provider about: °The right schedule for you to have regular tests and exams. °Things you can do on your own to prevent diseases and keep yourself healthy. °What should I know about diet, weight, and exercise? °Eat a healthy diet ° °Eat a diet that includes plenty of vegetables, fruits, low-fat dairy products, and lean protein. °Do not eat a lot of foods that are high in solid fats, added sugars, or sodium. °Maintain a healthy weight °Body mass index (BMI) is a measurement that can be used to identify possible weight problems. It estimates body fat based on height and weight. Your health care provider can help determine your BMI and help you achieve or maintain a healthy weight. °Get regular exercise °Get regular exercise. This is one of the most important things you can do for your health. Most adults should: °Exercise for at least 150 minutes each week. The exercise should increase your heart rate and make you sweat (moderate-intensity exercise). °Do strengthening exercises at least twice a week. This is in addition to the moderate-intensity exercise. °Spend less time sitting. Even light physical activity can be beneficial. °Watch cholesterol and blood lipids °Have your blood tested for lipids and cholesterol at 51 years of age, then have this test every 5 years. °You may need to have your cholesterol levels checked more often if: °Your lipid or cholesterol levels are high. °You are older than 51 years of age. °You are at high risk for heart disease. °What should I know about cancer screening? °Many types of cancers can be detected early and may often be prevented. Depending on your health history and family history, you may need to have cancer screening at various ages. This may include screening for: °Colorectal cancer. °Prostate cancer. °Skin cancer. °Lung  cancer. °What should I know about heart disease, diabetes, and high blood pressure? °Blood pressure and heart disease °High blood pressure causes heart disease and increases the risk of stroke. This is more likely to develop in people who have high blood pressure readings or are overweight. °Talk with your health care provider about your target blood pressure readings. °Have your blood pressure checked: °Every 3-5 years if you are 18-39 years of age. °Every year if you are 40 years old or older. °If you are between the ages of 65 and 75 and are a current or former smoker, ask your health care provider if you should have a one-time screening for abdominal aortic aneurysm (AAA). °Diabetes °Have regular diabetes screenings. This checks your fasting blood sugar level. Have the screening done: °Once every three years after age 45 if you are at a normal weight and have a low risk for diabetes. °More often and at a younger age if you are overweight or have a high risk for diabetes. °What should I know about preventing infection? °Hepatitis B °If you have a higher risk for hepatitis B, you should be screened for this virus. Talk with your health care provider to find out if you are at risk for hepatitis B infection. °Hepatitis C °Blood testing is recommended for: °Everyone born from 1945 through 1965. °Anyone with known risk factors for hepatitis C. °Sexually transmitted infections (STIs) °You should be screened each year for STIs, including gonorrhea and chlamydia, if: °You are sexually active and are younger than 51 years of age. °You are older than 51 years of age and your   health care provider tells you that you are at risk for this type of infection. Your sexual activity has changed since you were last screened, and you are at increased risk for chlamydia or gonorrhea. Ask your health care provider if you are at risk. Ask your health care provider about whether you are at high risk for HIV. Your health care provider  may recommend a prescription medicine to help prevent HIV infection. If you choose to take medicine to prevent HIV, you should first get tested for HIV. You should then be tested every 3 months for as long as you are taking the medicine. Follow these instructions at home: Alcohol use Do not drink alcohol if your health care provider tells you not to drink. If you drink alcohol: Limit how much you have to 0-2 drinks a day. Know how much alcohol is in your drink. In the [place] one drink equals one 12 oz bottle of beer (355 mL), one 5 oz glass of wine (148 mL), or one 1 oz glass of hard liquor (44 mL). Lifestyle Do not use any products that contain nicotine or tobacco. These products include cigarettes, chewing tobacco, and vaping devices, such as e-cigarettes. If you need help quitting, ask your health care provider. Do not use street drugs. Do not share needles. Ask your health care provider for help if you need support or information about quitting drugs. General instructions Schedule regular health, dental, and eye exams. Stay current with your vaccines. Tell your health care provider if: You often feel depressed. You have ever been abused or do not feel safe at home. Summary Adopting a healthy lifestyle and getting preventive care are important in promoting health and wellness. Follow your health care provider's instructions about healthy diet, exercising, and getting tested or screened for diseases. Follow your health care provider's instructions on monitoring your cholesterol and blood pressure. This information is not intended to replace advice given to you by your health care provider. Make sure you discuss any questions you have with your health care provider. Document Revised: 07/03/2020 Document Reviewed: 07/03/2020 Elsevier Patient Education  2022 [employer]  Testicular Cancer Testicular cancer is the presence of a cancerous (malignant) tumor in one or both of the male sex  glands that produce testosterone and sperm (testicles). A tumor is an abnormal growth of cells and tissue. Testicular cancer is the most common cancer in men ages 59-22 years old and the second most common cancer in men ages 60-75 years old. What are the causes? The cause of this condition is not known. What increases the risk? The following factors may make you more likely to develop this condition: Having had an undescended testicle. Having had abnormal development of the testicles. Having had testicular cancer in the past. Having a family history of testicular cancer, especially a father or brother. Having Klinefelter syndrome. This is a condition in which a male child shows some male characteristics. Being white (Caucasian). Having the HIV infection. What are the signs or symptoms? Symptoms of this condition include: Swelling of the scrotum. A change in how your testicle feels. A painless bump or swelling in the testicle. Dull ache or feeling of heaviness in the lower abdomen. Pain or discomfort in the testicles or scrotum. Sudden buildup of fluid in the scrotum. Growth of breast tissue. The breast area may feel painful or sore. Advanced symptoms of the condition include: Lower back pain. Shortness of breath Chest pain. A cough. Abdominal pain. Headache. Feeling confused. How is  this diagnosed? This condition is diagnosed based on your medical history and a physical exam, which will include checking your testicles for lumps, swelling, or pain. You may also have tests done, including: Ultrasound of the testicles. A blood test that looks for substances that are linked to specific types of cancer (serum tumor marker test). A surgical procedure to remove the entire testicle in order to test it for cancer cells (radical inguinal orchiectomy). Imaging tests such as X-rays, CT scan, MRI, or bone scan. If testicular cancer is confirmed, it will be staged to determine its severity and  extent. Staging determines: The size of the tumor. Whether or not the cancer has spread. Where the cancer has spread. How is this treated? Once your cancer has been diagnosed and staged, you should discuss a treatment plan with your health care provider. Based on the stage of the cancer, one treatment or a combination of treatments may be recommended. The most common forms of treatment are: Surgery. Radiation therapy. Chemotherapy. High-dose chemotherapy followed by stem cell transplant. Certain treatments for testicular cancer can cause infertility, a condition that could become permanent. Talk to your health care provider about the risks of treatment and your options if you want to have children. Follow these instructions at home: Take over-the-counter and prescription medicines only as told by your health care provider. Maintain a healthy diet. Talk with your dietitian, or your health care provider, about what dietary choices are best for you. If you have to go to the hospital, let your cancer specialist know. Consider joining a support group. This may help you learn to cope with the stress of having testicular cancer. Seek advice to help you manage treatment side effects. Keep all follow-up visits. This is important. Where to find more information American Cancer Society: www.cancer.org or 229 651 7528. National Cancer Institute: www.cancer.gov or 1-800-4-CANCER. Contact a health care provider if: You have a dull ache or feeling of heaviness in your lower abdomen or groin. You have a sudden buildup of fluid in your scrotum. You have new symptoms such as headache, abdominal pain, or lower back pain. Get help right away if: You have an increase in pain, discomfort, or swelling in your testicle or scrotum. You have a cough, shortness of breath, or chest pain. You feel confused. Summary Testicular cancer is the presence of a cancerous (malignant) tumor in one or both of the male sex  glands that produce testosterone and sperm (testicles). Once your testicular cancer has been diagnosed and staged, you should discuss a treatment plan with your health care provider. Based on the stage of the cancer, one treatment or a combination of treatments may be recommended. The most common forms of treatment are surgery, radiation therapy, chemotherapy, and high-dose chemotherapy followed by stem cell transplant. This information is not intended to replace advice given to you by your health care provider. Make sure you discuss any questions you have with your health care provider. Document Revised: 08/11/2020 Document Reviewed: 08/11/2020 Elsevier Patient Education  2022 [employer].

## 2021-04-06 ENCOUNTER — Telehealth: Payer: Self-pay

## 2021-04-06 ENCOUNTER — Other Ambulatory Visit: Payer: Self-pay | Admitting: Adult Health

## 2021-04-06 ENCOUNTER — Encounter: Payer: Self-pay | Admitting: Adult Health

## 2021-04-06 DIAGNOSIS — E559 Vitamin D deficiency, unspecified: Secondary | ICD-10-CM

## 2021-04-06 MED ORDER — VITAMIN D (ERGOCALCIFEROL) 1.25 MG (50000 UNIT) PO CAPS: 50000.0000 [IU] | ORAL_CAPSULE | ORAL | 1 refills | Status: DC

## 2021-04-06 NOTE — Telephone Encounter (Signed)
Lvm for pt to return call in regards to labs.  ?

## 2021-04-06 NOTE — Progress Notes (Signed)
Vitamin d is very low, has he been taking supplement for vitamin D  ? LDL elevated.  Discuss lifestyle modification with patient e.g. increase exercise, fiber, fruits, vegetables, lean meat, and omega 3/fish intake and decrease saturated fat.  If patient following strict diet and exercise program already please schedule follow up appointment with primary care physician. Urinalysis ok.  TSH thyroid normal.  Hemoglobin A1C non diabetic. PSA for prostate within normal. CBC within normal limits. CMP is within normal limits.   Vitamin  D is low, this can contribute to poor sleep and fatigue, will send in prescription for Vitamin D at 50,000 units by mouth once every 7 days/(once weekly) for 12 weeks. Advise recheck lab Vitamin D in 1-2 weeks after completing vitamin d prescription. Labs need to be scheduled. Vitamin d lab and schedule please in 3 months, no office visit needed just lab recheck.

## 2021-04-09 ENCOUNTER — Other Ambulatory Visit: Payer: Self-pay

## 2021-04-09 DIAGNOSIS — E559 Vitamin D deficiency, unspecified: Secondary | ICD-10-CM

## 2021-05-02 ENCOUNTER — Other Ambulatory Visit: Payer: Self-pay

## 2021-05-02 ENCOUNTER — Other Ambulatory Visit (INDEPENDENT_AMBULATORY_CARE_PROVIDER_SITE_OTHER): Payer: BC Managed Care – PPO

## 2021-05-02 DIAGNOSIS — Z87898 Personal history of other specified conditions: Secondary | ICD-10-CM | POA: Diagnosis not present

## 2021-05-02 DIAGNOSIS — E559 Vitamin D deficiency, unspecified: Secondary | ICD-10-CM | POA: Diagnosis not present

## 2021-05-02 LAB — CBC WITH DIFFERENTIAL/PLATELET
Basophils Absolute: 0 10*3/uL (ref 0.0–0.1)
Basophils Relative: 1 % (ref 0.0–3.0)
Eosinophils Absolute: 0.2 10*3/uL (ref 0.0–0.7)
Eosinophils Relative: 4.4 % (ref 0.0–5.0)
HCT: 42.4 % (ref 39.0–52.0)
Hemoglobin: 14.5 g/dL (ref 13.0–17.0)
Lymphocytes Relative: 24 % (ref 12.0–46.0)
Lymphs Abs: 1.1 10*3/uL (ref 0.7–4.0)
MCHC: 34.2 g/dL (ref 30.0–36.0)
MCV: 90.3 fl (ref 78.0–100.0)
Monocytes Absolute: 0.4 10*3/uL (ref 0.1–1.0)
Monocytes Relative: 8.9 % (ref 3.0–12.0)
Neutro Abs: 2.9 10*3/uL (ref 1.4–7.7)
Neutrophils Relative %: 61.7 % (ref 43.0–77.0)
Platelets: 268 10*3/uL (ref 150.0–400.0)
RBC: 4.69 Mil/uL (ref 4.22–5.81)
RDW: 13.6 % (ref 11.5–15.5)
WBC: 4.6 10*3/uL (ref 4.0–10.5)

## 2021-05-02 LAB — VITAMIN D 25 HYDROXY (VIT D DEFICIENCY, FRACTURES): VITD: 27.33 ng/mL — ABNORMAL LOW (ref 30.00–100.00)

## 2021-05-03 ENCOUNTER — Telehealth: Payer: Self-pay

## 2021-05-03 ENCOUNTER — Other Ambulatory Visit: Payer: Self-pay | Admitting: Adult Health

## 2021-05-03 DIAGNOSIS — E559 Vitamin D deficiency, unspecified: Secondary | ICD-10-CM

## 2021-05-03 MED ORDER — VITAMIN D (ERGOCALCIFEROL) 1.25 MG (50000 UNIT) PO CAPS: 50000.0000 [IU] | ORAL_CAPSULE | ORAL | 1 refills | Status: AC

## 2021-05-03 NOTE — Progress Notes (Signed)
CBC is within normal limits.  ?Vitamin D improving, will send refill of vitamin D- advise to take as prescribed and have follow up lab vitamin D in 4 months with new PCP.

## 2021-05-03 NOTE — Telephone Encounter (Signed)
-----   Message from Michelle S Flinchum, FNP sent at 05/03/2021 10:24 AM EST ----- ?CBC is within normal limits.  ?Vitamin D improving, will send refill of vitamin D- advise to take as prescribed and have follow up lab vitamin D in 4 months with new PCP.  ?

## 2021-05-03 NOTE — Telephone Encounter (Signed)
-----   Message from Berniece Pap, FNP sent at 05/03/2021 10:24 AM EST ----- ?CBC is within normal limits.  ?Vitamin D improving, will send refill of vitamin D- advise to take as prescribed and have follow up lab vitamin D in 4 months with new PCP.  ?

## 2021-10-02 ENCOUNTER — Ambulatory Visit: Payer: BC Managed Care – PPO | Admitting: Adult Health

## 2022-02-12 DIAGNOSIS — F330 Major depressive disorder, recurrent, mild: Secondary | ICD-10-CM | POA: Diagnosis not present

## 2022-02-12 DIAGNOSIS — K219 Gastro-esophageal reflux disease without esophagitis: Secondary | ICD-10-CM | POA: Diagnosis not present

## 2022-02-12 DIAGNOSIS — F411 Generalized anxiety disorder: Secondary | ICD-10-CM | POA: Diagnosis not present

## 2022-02-12 DIAGNOSIS — K2270 Barrett's esophagus without dysplasia: Secondary | ICD-10-CM | POA: Diagnosis not present

## 2022-04-26 DIAGNOSIS — F411 Generalized anxiety disorder: Secondary | ICD-10-CM | POA: Diagnosis not present

## 2022-04-26 DIAGNOSIS — F330 Major depressive disorder, recurrent, mild: Secondary | ICD-10-CM | POA: Diagnosis not present

## 2022-06-28 DIAGNOSIS — F330 Major depressive disorder, recurrent, mild: Secondary | ICD-10-CM | POA: Diagnosis not present

## 2022-06-28 DIAGNOSIS — F411 Generalized anxiety disorder: Secondary | ICD-10-CM | POA: Diagnosis not present
